# Patient Record
Sex: Male | Born: 1950 | Race: Black or African American | Hispanic: No | Marital: Married | Smoking: Never smoker
Health system: Southern US, Community
[De-identification: ages and names within clinical notes are randomized; demographics above are authoritative.]

## PROBLEM LIST (undated history)

## (undated) DIAGNOSIS — R42 Dizziness and giddiness: Secondary | ICD-10-CM

## (undated) DIAGNOSIS — R55 Syncope and collapse: Secondary | ICD-10-CM

## (undated) DIAGNOSIS — E785 Hyperlipidemia, unspecified: Secondary | ICD-10-CM

## (undated) DIAGNOSIS — I2510 Atherosclerotic heart disease of native coronary artery without angina pectoris: Secondary | ICD-10-CM

## (undated) DIAGNOSIS — H3500 Unspecified background retinopathy: Secondary | ICD-10-CM

## (undated) DIAGNOSIS — N400 Enlarged prostate without lower urinary tract symptoms: Secondary | ICD-10-CM

## (undated) HISTORY — DX: Syncope and collapse: R55

## (undated) HISTORY — DX: Unspecified background retinopathy: H35.00

## (undated) HISTORY — PX: CARDIAC CATHETERIZATION: SHX172

## (undated) HISTORY — PX: FLEXIBLE SIGMOIDOSCOPY: SHX5431

## (undated) HISTORY — PX: OTHER SURGICAL HISTORY: SHX169

## (undated) HISTORY — DX: Hyperlipidemia, unspecified: E78.5

---

## 2001-06-07 ENCOUNTER — Encounter (INDEPENDENT_AMBULATORY_CARE_PROVIDER_SITE_OTHER): Payer: Self-pay | Admitting: Specialist

## 2001-06-07 ENCOUNTER — Ambulatory Visit (HOSPITAL_COMMUNITY): Admission: RE | Admit: 2001-06-07 | Discharge: 2001-06-07 | Payer: Self-pay | Admitting: *Deleted

## 2009-02-09 ENCOUNTER — Encounter: Admission: RE | Admit: 2009-02-09 | Discharge: 2009-02-09 | Payer: Self-pay | Admitting: Otolaryngology

## 2012-05-17 ENCOUNTER — Encounter (HOSPITAL_COMMUNITY)
Admission: EM | Disposition: A | Discharge status: Home / Self Care | Payer: Self-pay | Source: Ambulatory Visit | Attending: Cardiology

## 2012-05-17 ENCOUNTER — Inpatient Hospital Stay (HOSPITAL_COMMUNITY)
Admission: EM | Admit: 2012-05-17 | Discharge: 2012-05-18 | DRG: 247 | Disposition: A | Discharge status: Home / Self Care | Payer: No Typology Code available for payment source | Source: Ambulatory Visit | Attending: Cardiology | Admitting: Cardiology

## 2012-05-17 ENCOUNTER — Other Ambulatory Visit: Payer: Self-pay | Admitting: Cardiology

## 2012-05-17 DIAGNOSIS — I200 Unstable angina: Secondary | ICD-10-CM

## 2012-05-17 DIAGNOSIS — R9439 Abnormal result of other cardiovascular function study: Secondary | ICD-10-CM

## 2012-05-17 DIAGNOSIS — I2510 Atherosclerotic heart disease of native coronary artery without angina pectoris: Principal | ICD-10-CM | POA: Diagnosis present

## 2012-05-17 DIAGNOSIS — E785 Hyperlipidemia, unspecified: Secondary | ICD-10-CM | POA: Diagnosis present

## 2012-05-17 DIAGNOSIS — Z79899 Other long term (current) drug therapy: Secondary | ICD-10-CM

## 2012-05-17 DIAGNOSIS — I10 Essential (primary) hypertension: Secondary | ICD-10-CM | POA: Diagnosis present

## 2012-05-17 DIAGNOSIS — Z955 Presence of coronary angioplasty implant and graft: Secondary | ICD-10-CM

## 2012-05-17 DIAGNOSIS — Z8249 Family history of ischemic heart disease and other diseases of the circulatory system: Secondary | ICD-10-CM

## 2012-05-17 DIAGNOSIS — R079 Chest pain, unspecified: Secondary | ICD-10-CM

## 2012-05-17 DIAGNOSIS — I249 Acute ischemic heart disease, unspecified: Secondary | ICD-10-CM

## 2012-05-17 HISTORY — PX: LEFT HEART CATHETERIZATION WITH CORONARY ANGIOGRAM: SHX5451

## 2012-05-17 LAB — COMPREHENSIVE METABOLIC PANEL
Albumin: 4.4 g/dL (ref 3.5–5.2)
Alkaline Phosphatase: 57 U/L (ref 39–117)
BUN: 12 mg/dL (ref 6–23)
Calcium: 9.7 mg/dL (ref 8.4–10.5)
Potassium: 3.8 mEq/L (ref 3.5–5.1)
Sodium: 136 mEq/L (ref 135–145)
Total Protein: 7.7 g/dL (ref 6.0–8.3)

## 2012-05-17 LAB — CBC WITH DIFFERENTIAL/PLATELET
Basophils Absolute: 0 10*3/uL (ref 0.0–0.1)
Basophils Relative: 0 % (ref 0–1)
Eosinophils Absolute: 0 10*3/uL (ref 0.0–0.7)
MCHC: 36.8 g/dL — ABNORMAL HIGH (ref 30.0–36.0)
Monocytes Absolute: 0 10*3/uL — ABNORMAL LOW (ref 0.1–1.0)
Neutro Abs: 9 10*3/uL — ABNORMAL HIGH (ref 1.7–7.7)
Neutrophils Relative %: 94 % — ABNORMAL HIGH (ref 43–77)
RDW: 12.9 % (ref 11.5–15.5)

## 2012-05-17 LAB — APTT: aPTT: 37 seconds (ref 24–37)

## 2012-05-17 LAB — MRSA PCR SCREENING: MRSA by PCR: NEGATIVE

## 2012-05-17 LAB — PROTIME-INR: INR: 1.29 (ref 0.00–1.49)

## 2012-05-17 SURGERY — LEFT HEART CATHETERIZATION WITH CORONARY ANGIOGRAM
Anesthesia: LOCAL

## 2012-05-17 MED ORDER — VERAPAMIL HCL 2.5 MG/ML IV SOLN
INTRAVENOUS | Status: AC
  Filled 2012-05-17: qty 2

## 2012-05-17 MED ORDER — TICAGRELOR 90 MG PO TABS
ORAL_TABLET | ORAL | Status: AC
  Administered 2012-05-17: 90 mg via ORAL
  Filled 2012-05-17: qty 2

## 2012-05-17 MED ORDER — ONDANSETRON HCL 4 MG/2ML IJ SOLN: 4.0000 mg | Freq: Four times a day (QID) | INTRAMUSCULAR | Status: DC | PRN

## 2012-05-17 MED ORDER — ACETAMINOPHEN 325 MG PO TABS: 650.0000 mg | ORAL_TABLET | ORAL | Status: DC | PRN

## 2012-05-17 MED ORDER — ASPIRIN EC 81 MG PO TBEC
81.0000 mg | DELAYED_RELEASE_TABLET | Freq: Every day | ORAL | Status: DC
  Administered 2012-05-18: 81 mg via ORAL
  Filled 2012-05-17: qty 1

## 2012-05-17 MED ORDER — MIDAZOLAM HCL 2 MG/2ML IJ SOLN
INTRAMUSCULAR | Status: AC
  Filled 2012-05-17: qty 2

## 2012-05-17 MED ORDER — LIDOCAINE HCL (PF) 1 % IJ SOLN
INTRAMUSCULAR | Status: AC
  Filled 2012-05-17: qty 30

## 2012-05-17 MED ORDER — HEPARIN (PORCINE) IN NACL 2-0.9 UNIT/ML-% IJ SOLN
INTRAMUSCULAR | Status: AC
  Filled 2012-05-17: qty 1000

## 2012-05-17 MED ORDER — HEPARIN SODIUM (PORCINE) 1000 UNIT/ML IJ SOLN
INTRAMUSCULAR | Status: AC
  Filled 2012-05-17: qty 1

## 2012-05-17 MED ORDER — METHYLPREDNISOLONE SODIUM SUCC 125 MG IJ SOLR
INTRAMUSCULAR | Status: AC
  Filled 2012-05-17: qty 2

## 2012-05-17 MED ORDER — SODIUM CHLORIDE 0.9 % IV SOLN
INTRAVENOUS | Status: AC
  Administered 2012-05-17 (×2): via INTRAVENOUS

## 2012-05-17 MED ORDER — ASPIRIN 81 MG PO CHEW: 81.0000 mg | CHEWABLE_TABLET | Freq: Every day | ORAL | Status: DC

## 2012-05-17 MED ORDER — TICAGRELOR 90 MG PO TABS
90.0000 mg | ORAL_TABLET | Freq: Two times a day (BID) | ORAL | Status: DC
  Administered 2012-05-18: 90 mg via ORAL
  Filled 2012-05-17 (×3): qty 1

## 2012-05-17 MED ORDER — FENTANYL CITRATE 0.05 MG/ML IJ SOLN
INTRAMUSCULAR | Status: AC
  Filled 2012-05-17: qty 2

## 2012-05-17 MED ORDER — DIPHENHYDRAMINE HCL 50 MG/ML IJ SOLN
INTRAMUSCULAR | Status: AC
  Filled 2012-05-17: qty 1

## 2012-05-17 MED ORDER — NITROGLYCERIN 1 MG/10 ML FOR IR/CATH LAB
INTRA_ARTERIAL | Status: AC
  Filled 2012-05-17: qty 10

## 2012-05-17 MED ORDER — BIVALIRUDIN 250 MG IV SOLR
INTRAVENOUS | Status: AC
  Filled 2012-05-17: qty 250

## 2012-05-17 MED ORDER — HEPARIN (PORCINE) IN NACL 2-0.9 UNIT/ML-% IJ SOLN
INTRAMUSCULAR | Status: AC
  Filled 2012-05-17: qty 500

## 2012-05-17 MED ORDER — NITROGLYCERIN 0.4 MG SL SUBL: 0.4000 mg | SUBLINGUAL_TABLET | SUBLINGUAL | Status: DC | PRN

## 2012-05-17 NOTE — H&P (Signed)
Admit date:05/17/2012 Referring Physician  Dr. Clarene Duke Primary Cardiologist:  Dr. Mayford Knife Chief complaint/reason for admission:chest pain  HPI: This is a 61yo BM with no PMH who presented to his primary MD with complaints of chest tightness intermittently last week with indigestion.  He was exercising at the gym last week and got the tightness again but felt like indigestion.  He went back to the gym the next day and the same symptoms occurred and he decided to go to the MD.  He presented today for ETT and was SOB on the treadmill.  In recovery he became dizzy, diaphoretic and dropped his SBP to and became nauseated.  His ST segments dropped horizontally by 2mm in recovery.      PMH:   None PSH:  Left arthroscopic knee surgery  ALLERGIES:  Lip swelling with shellfish Prior to Admit Meds:  Align, omeprazole Family HX:   Father died of pancreatic CA                      Mother died at 5 of MI                      Sisters 72 - several with CAD                      Brothers 2 Social HX:    History   Social History  . Marital Status: Married    Spouse Name: N/A    Number of Children: N/A  . Years of Education: N/A   Occupational History  . Not on file.   Social History Main Topics  . Smoking status: Never smoked  . Smokeless tobacco: None  . Alcohol Use: occasional wine  . Drug Use: No  . Sexually Active: Not on file   Other Topics Concern  . Not on file   Social History Narrative  . No narrative on file     ROS:  All 11 ROS were addressed and are negative except what is stated in the HPI  PHYSICAL EXAM There were no vitals filed for this visit. General: Well developed, well nourished, in no acute distress Head: Eyes PERRLA, No xanthomas.   Normal cephalic and atramatic  Lungs:   Clear bilaterally to auscultation and percussion. Heart:   HRRR S1 S2 Pulses are 2+ & equal.            No carotid bruit. No JVD.  No abdominal bruits. No femoral bruits. Abdomen: Bowel  sounds are positive, abdomen soft and non-tender without masses or                  Hernia's noted. Msk:  Back normal, normal gait. Normal strength and tone for age. Extremities:   No clubbing, cyanosis or edema.  DP +1 Neuro: Alert and oriented X 3. Psych:  Good affect, responds appropriately        Radiology:  pending  EKG: NSR  ASSESSMENT:  1. Chest pain with markedly abnormal exercise treadmill test with 2mm of horizontal ST depression during stress test with marked hypotensive BP response to exercise.   PLAN:   1.  Emergent heart cath per Dr. Anne Fu. 2.  Cardiac catheterization was discussed with the patient fully including risks on myocardial infarction, death, stroke, bleeding, arrhythmia, dye allergy, renal insufficiency or bleeding.  All patient questions and concerns were discussed and the patient understands and is willing to proceed.   3. IV solumedrol  for shellfish allergy   Quintella Reichert, MD  05/17/2012  3:49 PM

## 2012-05-17 NOTE — H&P (View-Only) (Signed)
Admit date:05/17/2012 Referring Physician  Dr. Little Primary Cardiologist:  Dr. Nikolaj Geraghty Chief complaint/reason for admission:chest pain  HPI: This is a 61yo BM with no PMH who presented to his primary MD with complaints of chest tightness intermittently last week with indigestion.  He was exercising at the gym last week and got the tightness again but felt like indigestion.  He went back to the gym the next day and the same symptoms occurred and he decided to go to the MD.  He presented today for ETT and was SOB on the treadmill.  In recovery he became dizzy, diaphoretic and dropped his SBP to 80mmHg and became nauseated.  His ST segments dropped horizontally by 2mm in recovery.      PMH:   None PSH:  Left arthroscopic knee surgery  ALLERGIES:  Lip swelling with shellfish Prior to Admit Meds:  Align, omeprazole Family HX:   Father died of pancreatic CA                      Mother died at 62 of MI                      Sisters 9 - several with CAD                      Brothers 2 Social HX:    History   Social History  . Marital Status: Married    Spouse Name: N/A    Number of Children: N/A  . Years of Education: N/A   Occupational History  . Not on file.   Social History Main Topics  . Smoking status: Never smoked  . Smokeless tobacco: None  . Alcohol Use: occasional wine  . Drug Use: No  . Sexually Active: Not on file   Other Topics Concern  . Not on file   Social History Narrative  . No narrative on file     ROS:  All 11 ROS were addressed and are negative except what is stated in the HPI  PHYSICAL EXAM There were no vitals filed for this visit. General: Well developed, well nourished, in no acute distress Head: Eyes PERRLA, No xanthomas.   Normal cephalic and atramatic  Lungs:   Clear bilaterally to auscultation and percussion. Heart:   HRRR S1 S2 Pulses are 2+ & equal.            No carotid bruit. No JVD.  No abdominal bruits. No femoral bruits. Abdomen: Bowel  sounds are positive, abdomen soft and non-tender without masses or                  Hernia's noted. Msk:  Back normal, normal gait. Normal strength and tone for age. Extremities:   No clubbing, cyanosis or edema.  DP +1 Neuro: Alert and oriented X 3. Psych:  Good affect, responds appropriately        Radiology:  pending  EKG: NSR  ASSESSMENT:  1. Chest pain with markedly abnormal exercise treadmill test with 2mm of horizontal ST depression during stress test with marked hypotensive BP response to exercise.   PLAN:   1.  Emergent heart cath per Dr. Skains. 2.  Cardiac catheterization was discussed with the patient fully including risks on myocardial infarction, death, stroke, bleeding, arrhythmia, dye allergy, renal insufficiency or bleeding.  All patient questions and concerns were discussed and the patient understands and is willing to proceed.   3. IV solumedrol   for shellfish allergy   Damico Partin R, MD  05/17/2012  3:49 PM    

## 2012-05-17 NOTE — Interval H&P Note (Signed)
History and Physical Interval Note:  05/17/2012 4:32 PM  Stephen Curtis  has presented today for surgery, with the diagnosis of urgent  The various methods of treatment have been discussed with the patient and family. After consideration of risks, benefits and other options for treatment, the patient has consented to  Procedure(s): LEFT HEART CATHETERIZATION WITH CORONARY ANGIOGRAM (N/A) as a surgical intervention .  The patient's history has been reviewed, patient examined, no change in status, stable for surgery.  I have reviewed the patient's chart and labs.  Questions were answered to the patient's satisfaction.  Discussed with he and his wife prior to EMS arrival at office. Was diaphoretic and hypotensive with diffuse ST depression. Hg 15 and Creat normal in 2013.    SKAINS, MARK

## 2012-05-17 NOTE — CV Procedure (Signed)
CARDIAC CATHETERIZATION  PROCEDURE:  Left heart catheterization with selective coronary angiography, left ventriculogram via the radial artery approach.  INDICATIONS:  62 year old male, nondiabetic with strong family history of coronary artery disease who over the past several days has been experiencing chest tightness with exercise. He came in for a exercise treadmill test, Dr.Turner. Immediately post stress, he became hypotensive, diaphoretic, diffuse ST segment depression. He slowly recovered. Because of the changes, he was brought to the cardiac catheterization lab for urgent diagnostic cardiac catheterization. High-risk stress test, exercise treadmill test.  The risks, benefits, and details of the procedure were explained to the patient, including possibilities of stroke, heart attack, death, renal impairment, arterial damage, bleeding.  The patient verbalized understanding and wanted to proceed.  Informed written consent was obtained.  PROCEDURE TECHNIQUE:  Allen's test was performed pre-and post procedure and was normal. The right radial artery site was prepped and draped in a sterile fashion. One percent lidocaine was used for local anesthesia. Using the modified Seldinger technique a 5 French hydrophilic sheath was inserted into the radial artery without difficulty. 3 mg of verapamil was administered via the sheath. A Judkins right #4 catheter with the guidance of a Versicore wire was placed in the right coronary cusp and selectively cannulated the right coronary artery. After traversing the aortic arch, 4500 units of heparin IV was administered. A Judkins left #3.5 catheter was used to selectively cannulate the left main artery. Multiple views with hand injection of Omnipaque were obtained. Catheter a pigtail catheter was used to cross into the left ventricle, hemodynamics were obtained, and a left ventriculogram was performed in the RAO position with power injection. Following the procedure,  sheath was removed, patient was hemodynamically stable, hemostasis was maintained with a Terumo T band.   CONTRAST:  Total of 80 ml.    FLOUROSCOPY TIME: 5.9 min.  COMPLICATIONS:  None.    HEMODYNAMICS:  Aortic pressure was 112/49mmHg; LV systolic pressure was ; LVEDP .  There was no gradient between the left ventricle and aorta.    ANGIOGRAPHIC DATA:    Left main: Branches into LAD and circumflex artery, no significant disease.  Left anterior descending (LAD): There is moderate disease, approximately 50% after the diagonal branch. The remainder wraps around the apex. There are some distal left to right collaterals noted.  Circumflex artery (CIRC): 2 obtuse marginal branches, no angiographically significant disease.  Right coronary artery (RCA): Dominant vessel giving rise to the posterior descending artery. There is a proximal lesion of approximately 80%, mildly calcified, then a mid lesion of 99% with associated thrombus. TIMI 2-3 flow.  LEFT VENTRICULOGRAM:  Left ventricular angiogram was done in the 30 RAO projection and revealed normal left ventricular wall motion and systolic function with an estimated ejection fraction of 65%.   IMPRESSIONS:  Severe right coronary artery disease with both proximal and mid lesion, with mid lesion 99%. Associated thrombus. There is mid LAD disease of 50%. This lesion does not appear to be flow-limiting. Normal left ventricular systolic function.  LVEDP 15 mmHg.  Ejection fraction 65%.  RECOMMENDATION:  Discussed with patient, Dr. Katrinka Blazing. We will proceed with percutaneous intervention. Wife has been notified.

## 2012-05-17 NOTE — CV Procedure (Signed)
   PERCUTANEOUS CORONARY INTERVENTION   HILLMAN ATTIG is a 62 y.o. male  INDICATION:   Treadmill test with associated exercise-induced hypotension and EKG changes. The patient was brought immediately to the cath lab from office where Dr. Chales Abrahams performed coronary angiography via the right radial approach. The patient has moderate mid LAD disease in the 50-70% range. The right coronary contains a proximal 50-70% stenosis in the mid 99% stenosis with thrombus present. Distal flow is TIMI grade 2 with competitive flow. The right coronary is felt to be the culprit.   PROCEDURE: DES implantation proximal and mid RCA for acute coronary syndrome /early positive exercise treadmill test   CONSENT: The risks, benefits, and details of the procedure were explained to the patient. Risks including death, MI, stroke, bleeding, limb ischemia, renal failure and allergy were described and accepted by the patient.  Informed written consent was obtained prior to proceeding.  PROCEDURE TECHNIQUE:  After Xylocaine anesthesia, the 5 [place] was upgraded to a 6 French sheath  in the right radial artery.   Coronary guiding shots were made using a 6 French RCA XB guide catheter. Antithrombotic therapy, bivalirudin bolus and infusion, was begun and determined to be therapeutic by ACT. Antiplatelet therapy,Brilinta 180 mg, was loaded.  The guide catheter was a nice fit that provided adequate support. A BMW wire was used to cross the proximal and mid stenosis. Predilatation with a 3.0 x 12 mm long emerged balloon was performed in the mid and proximal lesions. A Promus Premier DES, 3.5 x 16 mm long, was placed in the mid lesion and deployed at 15 atmospheres. We then positioned and deployed a 3.5 x 12 mm Promus Premier drug-eluting stent in the proximal lesion at 14 atmospheres. High pressure post dilatation was then performed with a 3.75 x 12 mm long Jeanerette Quantum to 16 atmospheres in the mid vessel and 14 atmospheres in the  proximal vessel . The stents did not overlap. TIMI grade 3 flow was noted. No evidence of distal emboli were noted .  CONTRAST:  Total of 180 cc.  COMPLICATIONS:   none.    ANGIOGRAPHIC RESULTS:   70% proximal stenosis reduced to less than 90% with TIMI grade 3 flow. The 99% mid stenosis was reduced to 0% with TIMI grade 3 flow.   IMPRESSIONS:  Successful PCI of the proximal and mid RCA with drug-eluting stents post dilated to 3.75 mm in diameter at high pressure. TIMI grade 3 flow was noted.   RECOMMENDATION:  1. Aspirin and Brilinta.  2. Will need an exercise perfusion study to determine if the LAD is ischemia producing.  3. Plan discharge in a.m.    Lesleigh Noe, MD 05/17/2012 6:14 PM

## 2012-05-18 LAB — HEMOGLOBIN A1C
Hgb A1c MFr Bld: 5.8 % — ABNORMAL HIGH (ref <5.7)
Mean Plasma Glucose: 120 mg/dL — ABNORMAL HIGH (ref <117)

## 2012-05-18 LAB — TROPONIN I
Troponin I: 1.13 ng/mL (ref <0.30)
Troponin I: 0.82 ng/mL (ref <0.30)

## 2012-05-18 LAB — BASIC METABOLIC PANEL
BUN: 13 mg/dL (ref 6–23)
Chloride: 103 mEq/L (ref 96–112)
GFR calc Af Amer: 86 mL/min — ABNORMAL LOW (ref >90)
Potassium: 4.1 mEq/L (ref 3.5–5.1)
Sodium: 139 mEq/L (ref 135–145)

## 2012-05-18 LAB — POCT I-STAT, CHEM 8
Calcium, Ion: 1.28 mmol/L (ref 1.13–1.30)
Glucose, Bld: 108 mg/dL — ABNORMAL HIGH (ref 70–99)
HCT: 44 % (ref 39.0–52.0)
Hemoglobin: 15 g/dL (ref 13.0–17.0)
TCO2: 24 mmol/L (ref 0–100)

## 2012-05-18 LAB — CBC
HCT: 43 % (ref 39.0–52.0)
Platelets: 308 10*3/uL (ref 150–400)
RDW: 12.8 % (ref 11.5–15.5)
WBC: 8.8 10*3/uL (ref 4.0–10.5)

## 2012-05-18 LAB — CK TOTAL AND CKMB (NOT AT ARMC)
CK, MB: 6.2 ng/mL (ref 0.3–4.0)
Relative Index: 2.9 — ABNORMAL HIGH (ref 0.0–2.5)
Total CK: 217 U/L (ref 7–232)

## 2012-05-18 LAB — POCT ACTIVATED CLOTTING TIME: Activated Clotting Time: 410 seconds

## 2012-05-18 MED ORDER — ATORVASTATIN CALCIUM 80 MG PO TABS
80.0000 mg | ORAL_TABLET | Freq: Every day | ORAL | Status: DC
  Filled 2012-05-18: qty 1

## 2012-05-18 MED ORDER — TICAGRELOR 90 MG PO TABS: 90.0000 mg | ORAL_TABLET | Freq: Two times a day (BID) | ORAL | Status: DC

## 2012-05-18 MED ORDER — NITROGLYCERIN 0.4 MG SL SUBL: 0.4000 mg | SUBLINGUAL_TABLET | SUBLINGUAL | Status: DC | PRN

## 2012-05-18 MED ORDER — SODIUM CHLORIDE 0.9 % IV SOLN: INTRAVENOUS | Status: DC

## 2012-05-18 MED ORDER — ATORVASTATIN CALCIUM 40 MG PO TABS: 40.0000 mg | ORAL_TABLET | Freq: Every day | ORAL | Status: DC

## 2012-05-18 NOTE — Care Management Note (Signed)
    Page 1 of 1   05/18/2012     10:13:15 AM   CARE MANAGEMENT NOTE 05/18/2012  Patient:  Stephen Curtis, Stephen Curtis   Account Number:  [id number]  Date Initiated:  05/18/2012  Documentation initiated by:  Junius Creamer  Subjective/Objective Assessment:   adm w mi     Action/Plan:   lives w wife, has [employer] ins   Anticipated DC Date:     Anticipated DC Plan:  HOME/SELF CARE      DC Planning Services  CM consult  Medication Assistance      Choice offered to / List presented to:             Status of service:   Medicare Important Message given?   (If response is "NO", the following Medicare IM given date fields will be blank) Date Medicare IM given:   Date Additional Medicare IM given:    Discharge Disposition:  HOME/SELF CARE  Per UR Regulation:  Reviewed for med. necessity/level of care/duration of stay  If discussed at Long Length of Stay Meetings, dates discussed:    Comments:  3/7 1012 debbie dowell rn,bsn gave pt 30day free and brilinta copay assist card. pt has [employer] ins.

## 2012-05-18 NOTE — Progress Notes (Signed)
CARDIAC REHAB PHASE I   PRE:  Rate/Rhythm: 76 SR    BP: sitting 167/83    SaO2:   MODE:  Ambulation: 700 ft   POST:  Rate/Rhythm: 86 SR    BP: sitting 148/74     SaO2:   Tolerated very well. Active man. Ed completed. Thinking about CRPII and will send referral to [place] [facility]. BP elevated this am. Sts he usually has good BP. Will be good to monitor in CRPII. [id number]  Harriet Masson CES, ACSM

## 2012-05-18 NOTE — Discharge Summary (Addendum)
Patient ID: Stephen Curtis MRN: [id number] DOB/AGE: 04-26-1950 62 y.o.  Admit date: 05/17/2012 Discharge date: 05/18/2012  Primary Discharge Diagnosis: Unstable angina pectoris with early positive exercise treadmill test/high-risk features Secondary Discharge Diagnosis:  Shellfish allergy. No problems with catheterization although contrast precautions were taken  Significant Diagnostic Studies: 1. Diagnostic coronary angiography, Skains M.D.  2. DES implantation, [facility], 05/17/2012  Consults: interventional cardiology  Hospital Course: This a very pleasant 62-year-old gentleman with a one to two-week history of exertional chest discomfort. Please see the attached H&P by Dr. Mayford Knife. On the day of admission he felt an exercise treadmill test in the office by developing chest pain early in exercise, hypertension, and severe ST segment depression. Dr. Mayford Knife sent him straight to the catheterization laboratory where Dr. Dorothyann Peng performed diagnostic angiography and demonstrated high-grade right coronary disease with subtotal occlusion in the mid vessel and evidence of faint left to right collaterals. He was also noted to have an intermediate mid LAD stenosis in the 50-70% range.  Interventional cardiology, Dr. Verdis Prime, was consult at and it was deemed impaired if to proceed with percutaneous coronary intervention to relieve the obstruction in the right coronary. The patient underwent drug-eluting stent implantation in the proximal and mid vessel with an excellent final result and establishment of TIMI grade 3. He's had no symptoms with ambulation post procedure.  The patient had mild troponin elevation is without significant CK-MB or EKG changes. He is felt ready for discharge and will followup with Dr. Mayford Knife in 7 days.   Discharge Exam: Blood pressure 102/50, pulse 71, temperature 97.6 F (36.4 C), temperature source Oral, resp. rate 20, height 5\' 10"  (1.778 m), weight 96.163 kg  (212 lb), SpO2 96.00%.    Lungs are clear  Cardiac exam reveals an S4  The right radial cath site is unremarkable  The patient is neurologically intact.  Labs:   Lab Results  Component Value Date   WBC 8.8 05/18/2012   HGB 15.6 05/18/2012   HCT 43.0 05/18/2012   MCV 84.6 05/18/2012   PLT 308 05/18/2012     Recent Labs Lab 05/17/12 2134 05/18/12 0505  NA 136 139  K 3.8 4.1  CL 100 103  CO2 22 25  BUN 12 13  CREATININE 0.94 1.06  CALCIUM 9.7 10.0  PROT 7.7  --   BILITOT 0.5  --   ALKPHOS 57  --   ALT 23  --   AST 22  --   GLUCOSE 180* 112*   Lab Results  Component Value Date   CKTOTAL 217 05/18/2012   CKMB 6.2* 05/18/2012   TROPONINI 0.82* 05/18/2012       Radiology: Not performed  EKG: normal  FOLLOW UP PLANS AND APPOINTMENTS     Discharge Orders   Future Orders Complete By Expires     Amb Referral to Cardiac Rehabilitation  As directed         Medication List    STOP taking these medications       ALIGN 4 MG Caps     RED YEAST RICE PO      TAKE these medications       aspirin EC 81 MG tablet  Take 81 mg by mouth daily.     atorvastatin 40 MG tablet  Commonly known as:  LIPITOR  Take 1 tablet (40 mg total) by mouth daily.     fish oil-omega-3 fatty acids 1000 MG capsule  Take 1 g by  mouth daily.     multivitamin with minerals Tabs  Take 1 tablet by mouth daily.     nitroGLYCERIN 0.4 MG SL tablet  Commonly known as:  NITROSTAT  Place 1 tablet (0.4 mg total) under the tongue every 5 (five) minutes x 3 doses as needed for chest pain.     omeprazole 20 MG capsule  Commonly known as:  PRILOSEC  Take 20 mg by mouth daily.     Ticagrelor 90 MG Tabs tablet  Commonly known as:  BRILINTA  Take 1 tablet (90 mg total) by mouth 2 (two) times daily.       Follow-up Information   Follow up with Quintella Reichert, MD On 05/24/2012. (1:15 PM)    Contact information:   [address] [place] [address] [phone]       BRING ALL  MEDICATIONS WITH YOU TO FOLLOW UP APPOINTMENTS  Time spent with patient to include physician time: 29 minutes Signed: Lesleigh Noe 05/18/2012, 2:24 PM

## 2012-05-18 NOTE — Procedures (Signed)
PIV d/c'd. D/C instructions given to patient and verbalizes understanding. Will d/c via WC to private vehicle.

## 2012-05-18 NOTE — Progress Notes (Signed)
Patient Name: Stephen Curtis Date of Encounter: 05/18/2012    SUBJECTIVE:  He has no complaints but many questions. He wants to know if he can go to [place] next week.   TELEMETRY:  NSR: Filed Vitals:   05/18/12 0007 05/18/12 0112 05/18/12 0328 05/18/12 0738  BP:  111/66 115/51 115/71  Pulse:  76 72 72  Temp: 98.3 F (36.8 C)  97.6 F (36.4 C) 97.7 F (36.5 C)  TempSrc: Oral  Oral Oral  Resp:  19 11 20   Height:      Weight:      SpO2:  97% 100% 100%    Intake/Output Summary (Last 24 hours) at 05/18/12 0907 Last data filed at 05/18/12 0700  Gross per 24 hour  Intake   1250 ml  Output   2130 ml  Net   -880 ml    LABS: Basic Metabolic Panel:  Recent Labs  96/04/54 2134 05/18/12 0505  NA 136 139  K 3.8 4.1  CL 100 103  CO2 22 25  GLUCOSE 180* 112*  BUN 12 13  CREATININE 0.94 1.06  CALCIUM 9.7 10.0  MG 2.0  --    CBC:  Recent Labs  05/17/12 2134 05/18/12 0505  WBC 9.6 8.8  NEUTROABS 9.0*  --   HGB 16.1 15.6  HCT 43.7 43.0  MCV 84.7 84.6  PLT 280 308   Cardiac Enzymes:  Recent Labs  05/17/12 2134 05/18/12 0505  TROPONINI 0.44* 1.13*    Recent Labs  05/17/12 2134  HGBA1C 5.8*     Radiology/Studies:  none  Physical Exam: Blood pressure 115/71, pulse 72, temperature 97.7 F (36.5 C), temperature source Oral, resp. rate 20, height 5\' 10"  (1.778 m), weight 96.163 kg (212 lb), SpO2 100.00%. Weight change:    Radial cath site unremarkable.  Cardiac S4  Neurological unremarkable.  ASSESSMENT:  1. ACS treated with DES RCA x 2.  2. Hyperlipidemia    Plan:  1. Rehab I  2. Home later today  Signed, Lesleigh Noe 05/18/2012, 9:07 AM

## 2012-05-21 MED FILL — Dextrose Inj 5%: INTRAVENOUS | Qty: 50 | Status: AC

## 2012-06-07 ENCOUNTER — Ambulatory Visit (HOSPITAL_COMMUNITY): Payer: No Typology Code available for payment source

## 2012-06-14 ENCOUNTER — Encounter (HOSPITAL_COMMUNITY)
Admission: RE | Admit: 2012-06-14 | Discharge: 2012-06-14 | Disposition: A | Discharge status: Home / Self Care | Payer: No Typology Code available for payment source | Source: Ambulatory Visit | Attending: Cardiology | Admitting: Cardiology

## 2012-06-14 DIAGNOSIS — Z8249 Family history of ischemic heart disease and other diseases of the circulatory system: Secondary | ICD-10-CM | POA: Insufficient documentation

## 2012-06-14 DIAGNOSIS — Z79899 Other long term (current) drug therapy: Secondary | ICD-10-CM | POA: Insufficient documentation

## 2012-06-14 DIAGNOSIS — I10 Essential (primary) hypertension: Secondary | ICD-10-CM | POA: Insufficient documentation

## 2012-06-14 DIAGNOSIS — E785 Hyperlipidemia, unspecified: Secondary | ICD-10-CM | POA: Insufficient documentation

## 2012-06-14 DIAGNOSIS — I200 Unstable angina: Secondary | ICD-10-CM | POA: Insufficient documentation

## 2012-06-14 DIAGNOSIS — I2510 Atherosclerotic heart disease of native coronary artery without angina pectoris: Secondary | ICD-10-CM | POA: Insufficient documentation

## 2012-06-14 DIAGNOSIS — Z5189 Encounter for other specified aftercare: Secondary | ICD-10-CM | POA: Insufficient documentation

## 2012-06-14 DIAGNOSIS — Z9861 Coronary angioplasty status: Secondary | ICD-10-CM | POA: Insufficient documentation

## 2012-06-14 NOTE — Progress Notes (Signed)
Cardiac Rehab Medication Review by a Pharmacist  Does the patient  feel that his/her medications are working for him/her?  no  Has the patient been experiencing any side effects to the medications prescribed?  no  Does the patient measure his/her own blood pressure or blood glucose at home?  yes   Does the patient have any problems obtaining medications due to transportation or finances?   no  Understanding of regimen: good Understanding of indications: good Potential of compliance: excellent    Pharmacist comments: Pt presented in good spirits with well thought out questions. He seems very involved in his own care and expresses excellent adherence.    Drue Stager 06/14/2012 9:13 AM

## 2012-06-18 ENCOUNTER — Emergency Department (HOSPITAL_COMMUNITY): Payer: No Typology Code available for payment source

## 2012-06-18 ENCOUNTER — Inpatient Hospital Stay (HOSPITAL_COMMUNITY)
Admission: EM | Admit: 2012-06-18 | Discharge: 2012-06-20 | DRG: 312 | Disposition: A | Discharge status: Home / Self Care | Payer: No Typology Code available for payment source | Attending: Cardiology | Admitting: Cardiology

## 2012-06-18 ENCOUNTER — Encounter (HOSPITAL_COMMUNITY): Payer: Self-pay | Admitting: Vascular Surgery

## 2012-06-18 ENCOUNTER — Encounter (HOSPITAL_COMMUNITY)
Admission: RE | Admit: 2012-06-18 | Discharge: 2012-06-18 | Disposition: A | Discharge status: Home / Self Care | Payer: No Typology Code available for payment source | Source: Ambulatory Visit | Attending: Cardiology | Admitting: Cardiology

## 2012-06-18 ENCOUNTER — Other Ambulatory Visit: Payer: Self-pay

## 2012-06-18 ENCOUNTER — Encounter (HOSPITAL_COMMUNITY): Payer: No Typology Code available for payment source

## 2012-06-18 DIAGNOSIS — Z9861 Coronary angioplasty status: Secondary | ICD-10-CM

## 2012-06-18 DIAGNOSIS — R42 Dizziness and giddiness: Secondary | ICD-10-CM | POA: Diagnosis present

## 2012-06-18 DIAGNOSIS — R55 Syncope and collapse: Principal | ICD-10-CM | POA: Diagnosis present

## 2012-06-18 DIAGNOSIS — Z79899 Other long term (current) drug therapy: Secondary | ICD-10-CM

## 2012-06-18 DIAGNOSIS — N401 Enlarged prostate with lower urinary tract symptoms: Secondary | ICD-10-CM | POA: Diagnosis present

## 2012-06-18 DIAGNOSIS — E785 Hyperlipidemia, unspecified: Secondary | ICD-10-CM | POA: Diagnosis present

## 2012-06-18 DIAGNOSIS — Z8249 Family history of ischemic heart disease and other diseases of the circulatory system: Secondary | ICD-10-CM

## 2012-06-18 DIAGNOSIS — I2510 Atherosclerotic heart disease of native coronary artery without angina pectoris: Secondary | ICD-10-CM | POA: Diagnosis present

## 2012-06-18 DIAGNOSIS — Z7982 Long term (current) use of aspirin: Secondary | ICD-10-CM

## 2012-06-18 DIAGNOSIS — N400 Enlarged prostate without lower urinary tract symptoms: Secondary | ICD-10-CM | POA: Insufficient documentation

## 2012-06-18 DIAGNOSIS — R3911 Hesitancy of micturition: Secondary | ICD-10-CM | POA: Diagnosis present

## 2012-06-18 DIAGNOSIS — N138 Other obstructive and reflux uropathy: Secondary | ICD-10-CM | POA: Diagnosis present

## 2012-06-18 HISTORY — DX: Atherosclerotic heart disease of native coronary artery without angina pectoris: I25.10

## 2012-06-18 HISTORY — DX: Benign prostatic hyperplasia without lower urinary tract symptoms: N40.0

## 2012-06-18 HISTORY — DX: Dizziness and giddiness: R42

## 2012-06-18 LAB — COMPREHENSIVE METABOLIC PANEL
Alkaline Phosphatase: 62 U/L (ref 39–117)
Alkaline Phosphatase: 63 U/L (ref 39–117)
BUN: 12 mg/dL (ref 6–23)
BUN: 11 mg/dL (ref 6–23)
CO2: 26 mEq/L (ref 19–32)
Calcium: 9.7 mg/dL (ref 8.4–10.5)
Chloride: 107 mEq/L (ref 96–112)
Creatinine, Ser: 1.04 mg/dL (ref 0.50–1.35)
GFR calc Af Amer: 88 mL/min — ABNORMAL LOW (ref >90)
GFR calc Af Amer: >90 mL/min (ref >90)
GFR calc non Af Amer: 88 mL/min — ABNORMAL LOW (ref >90)
Glucose, Bld: 110 mg/dL — ABNORMAL HIGH (ref 70–99)
Glucose, Bld: 100 mg/dL — ABNORMAL HIGH (ref 70–99)
Potassium: 3.5 mEq/L (ref 3.5–5.1)
Potassium: 3.9 mEq/L (ref 3.5–5.1)
Sodium: 142 mEq/L (ref 135–145)
Total Protein: 7.1 g/dL (ref 6.0–8.3)
Total Protein: 7.2 g/dL (ref 6.0–8.3)

## 2012-06-18 LAB — TROPONIN I
Troponin I: <0.3 ng/mL (ref <0.30)
Troponin I: <0.3 ng/mL (ref <0.30)

## 2012-06-18 LAB — CBC WITH DIFFERENTIAL/PLATELET
Basophils Absolute: 0 10*3/uL (ref 0.0–0.1)
HCT: 39.8 % (ref 39.0–52.0)
Lymphocytes Relative: 19 % (ref 12–46)
Monocytes Absolute: 0.6 10*3/uL (ref 0.1–1.0)
Neutro Abs: 3.9 10*3/uL (ref 1.7–7.7)
Platelets: 223 10*3/uL (ref 150–400)
RDW: 12.8 % (ref 11.5–15.5)
WBC: 5.7 10*3/uL (ref 4.0–10.5)

## 2012-06-18 LAB — URINALYSIS, ROUTINE W REFLEX MICROSCOPIC
Ketones, ur: NEGATIVE mg/dL
Leukocytes, UA: NEGATIVE
Nitrite: NEGATIVE
Protein, ur: NEGATIVE mg/dL

## 2012-06-18 LAB — CBC
MCHC: 36.9 g/dL — ABNORMAL HIGH (ref 30.0–36.0)
MCV: 84.1 fL (ref 78.0–100.0)
Platelets: 235 10*3/uL (ref 150–400)
RDW: 12.8 % (ref 11.5–15.5)
WBC: 8.1 10*3/uL (ref 4.0–10.5)

## 2012-06-18 LAB — D-DIMER, QUANTITATIVE: D-Dimer, Quant: <0.27 ug/mL-FEU (ref 0.00–0.48)

## 2012-06-18 MED ORDER — SODIUM CHLORIDE 0.9 % IV BOLUS (SEPSIS)
1000.0000 mL | Freq: Once | INTRAVENOUS | Status: AC
  Administered 2012-06-18: 1000 mL via INTRAVENOUS

## 2012-06-18 MED ORDER — NITROGLYCERIN 0.4 MG SL SUBL: 0.4000 mg | SUBLINGUAL_TABLET | SUBLINGUAL | Status: DC | PRN

## 2012-06-18 MED ORDER — ENOXAPARIN SODIUM 40 MG/0.4ML ~~LOC~~ SOLN
40.0000 mg | SUBCUTANEOUS | Status: DC
  Administered 2012-06-18 – 2012-06-19 (×2): 40 mg via SUBCUTANEOUS
  Filled 2012-06-18 (×3): qty 0.4

## 2012-06-18 MED ORDER — ONDANSETRON HCL 4 MG/2ML IJ SOLN: 4.0000 mg | Freq: Four times a day (QID) | INTRAMUSCULAR | Status: DC | PRN

## 2012-06-18 MED ORDER — SODIUM CHLORIDE 0.9 % IJ SOLN: 3.0000 mL | INTRAMUSCULAR | Status: DC | PRN

## 2012-06-18 MED ORDER — SODIUM CHLORIDE 0.9 % IJ SOLN
3.0000 mL | Freq: Two times a day (BID) | INTRAMUSCULAR | Status: DC
  Administered 2012-06-18 – 2012-06-20 (×4): 3 mL via INTRAVENOUS

## 2012-06-18 MED ORDER — ATORVASTATIN CALCIUM 40 MG PO TABS
40.0000 mg | ORAL_TABLET | Freq: Every day | ORAL | Status: DC
  Administered 2012-06-19 – 2012-06-20 (×2): 40 mg via ORAL
  Filled 2012-06-18 (×3): qty 1

## 2012-06-18 MED ORDER — TICAGRELOR 90 MG PO TABS
90.0000 mg | ORAL_TABLET | Freq: Two times a day (BID) | ORAL | Status: DC
  Filled 2012-06-18: qty 1

## 2012-06-18 MED ORDER — ACETAMINOPHEN 325 MG PO TABS: 650.0000 mg | ORAL_TABLET | ORAL | Status: DC | PRN

## 2012-06-18 MED ORDER — SODIUM CHLORIDE 0.9 % IV SOLN: 250.0000 mL | INTRAVENOUS | Status: DC | PRN

## 2012-06-18 MED ORDER — TICAGRELOR 90 MG PO TABS
90.0000 mg | ORAL_TABLET | Freq: Two times a day (BID) | ORAL | Status: DC
  Administered 2012-06-18 – 2012-06-20 (×4): 90 mg via ORAL
  Filled 2012-06-18 (×5): qty 1

## 2012-06-18 MED ORDER — ADULT MULTIVITAMIN W/MINERALS CH
1.0000 | ORAL_TABLET | Freq: Every day | ORAL | Status: DC
  Administered 2012-06-18 – 2012-06-20 (×3): 1 via ORAL
  Filled 2012-06-18 (×3): qty 1

## 2012-06-18 MED ORDER — ASPIRIN EC 81 MG PO TBEC
81.0000 mg | DELAYED_RELEASE_TABLET | Freq: Every day | ORAL | Status: DC
  Administered 2012-06-19 – 2012-06-20 (×2): 81 mg via ORAL
  Filled 2012-06-18 (×2): qty 1

## 2012-06-18 NOTE — ED Provider Notes (Signed)
History     CSN: [id number]  Arrival date & time 06/18/12  1557   First MD Initiated Contact with Patient 06/18/12 1621      Chief Complaint  Patient presents with  . Near Syncope    (Consider location/radiation/quality/duration/timing/severity/associated sxs/prior treatment) HPI Comments: Patient in cardiac rehabilitation after episode of syncope. He finished using the standard machine to the bathroom where he had sudden onset of lightheadedness and became flushed and woke up on the floor. He then had another episode of nausea diaphoresis flushing lightheadedness when he almost lost consciousness. He is not be hypotensive in the 60s and bradycardic in the 50s. Denies any chest pain, shortness of breath, vomiting. Is back to baseline now. Had 2 stents placed March 6. Denies any recent illnesses. Good by mouth intake and urine output. No fevers. Felt well prior to rehabilitation.  The history is provided by the patient and a caregiver.    Past Medical History  Diagnosis Date  . BPH (benign prostatic hypertrophy)   . Hyperlipemia   . Vertigo   . CAD (coronary artery disease)     Past Surgical History  Procedure Laterality Date  . Cardiac catheterization    . Knee arthroscopic Left     History reviewed. No pertinent family history.  History  Substance Use Topics  . Smoking status: Never Smoker   . Smokeless tobacco: Never Used  . Alcohol Use: Yes     Comment: occasionally      Review of Systems  Constitutional: Positive for activity change, appetite change and fatigue. Negative for fever.  HENT: Negative for congestion and rhinorrhea.   Respiratory: Negative for cough, chest tightness and shortness of breath.   Cardiovascular: Negative for chest pain.  Gastrointestinal: Positive for nausea. Negative for abdominal pain.  Genitourinary: Negative for dysuria and hematuria.  Musculoskeletal: Negative for back pain.  Skin: Negative for rash.  Neurological: Positive for  dizziness, syncope, weakness and light-headedness. Negative for headaches.  A complete 10 system review of systems was obtained and all systems are negative except as noted in the HPI and PMH.    Allergies  Shrimp  Home Medications   No current outpatient prescriptions on file.  BP 130/82  Pulse 64  Temp(Src) 97.9 F (36.6 C) (Oral)  Resp 18  Ht 5' 10.5" (1.791 m)  Wt 201 lb 11.5 oz (91.5 kg)  BMI 28.53 kg/m2  SpO2 100%  Physical Exam  Constitutional: He is oriented to person, place, and time. He appears well-developed and well-nourished. No distress.  HENT:  Head: Normocephalic and atraumatic.  Mouth/Throat: Oropharynx is clear and moist. No oropharyngeal exudate.  Eyes: Conjunctivae and EOM are normal. Pupils are equal, round, and reactive to light.  Neck: Normal range of motion. Neck supple.  Cardiovascular: Normal rate, regular rhythm and normal heart sounds.   No murmur heard. Pulmonary/Chest: Effort normal and breath sounds normal. No respiratory distress.  Abdominal: Soft. There is no tenderness. There is no rebound and no guarding.  Musculoskeletal: Normal range of motion. He exhibits no edema and no tenderness.  Neurological: He is alert and oriented to person, place, and time. No cranial nerve deficit. He exhibits normal muscle tone. Coordination normal.  5 out of 5 strength throughout, cranial nerves 2 to 12 intact, no ataxia finger to nose  Skin: Skin is warm.    ED Course  Procedures (including critical care time)  Labs Reviewed  COMPREHENSIVE METABOLIC PANEL - Abnormal; Notable for the following:    Glucose,  Bld 110 (*)    GFR calc non Af Amer 76 (*)    GFR calc Af Amer 88 (*)    All other components within normal limits  CBC WITH DIFFERENTIAL - Abnormal; Notable for the following:    MCHC 36.4 (*)    All other components within normal limits  COMPREHENSIVE METABOLIC PANEL - Abnormal; Notable for the following:    Glucose, Bld 100 (*)    GFR calc non  Af Amer 88 (*)    All other components within normal limits  CBC - Abnormal; Notable for the following:    MCHC 36.9 (*)    All other components within normal limits  TROPONIN I  PRO B NATRIURETIC PEPTIDE  D-DIMER, QUANTITATIVE  URINALYSIS, ROUTINE W REFLEX MICROSCOPIC  TROPONIN I  TROPONIN I  TROPONIN I  POCT I-STAT TROPONIN I   Dg Chest 2 View  06/18/2012  *RADIOLOGY REPORT*  Clinical Data: Syncopal episode today  CHEST - 2 VIEW  Comparison: None.  Findings: No active infiltrate or effusion is seen.  Mediastinal contours appear normal.  The heart is within upper limits of normal.  There are mild degenerative changes throughout the thoracic spine.  IMPRESSION: No active lung disease.  Heart upper normal.   Original Report Authenticated By: Dwyane Dee, M.D.      1. Syncope       MDM  Syncopal episode with bradycardia and hypotension. No chest pain or shortness of breath. Good by mouth intake and urine output. Feeling better at this time.  Blood pressure has improved to 130s. Patient is not orthostatic. EKG is unchanged. No focal neurological deficits.  Troponin negative. Suspect probable vasovagal syncope though cannot rule out ischemic event. Patient with known LAD disease it was not intervened upon.  Discussed with Dr. Donnie Aho of cardiology who will admit patient for observation.     Date: 06/18/2012  Rate: 59  Rhythm: normal sinus rhythm  QRS Axis: normal  Intervals: normal  ST/T Wave abnormalities: normal  Conduction Disutrbances:none  Narrative Interpretation:   Old EKG Reviewed: unchanged    Glynn Octave, MD 06/18/12 2326

## 2012-06-18 NOTE — Progress Notes (Signed)
Stephen Curtis came for his first day of exercise at cardiac rehab. Entry blood pressure 116/76.  Telemetry rhythm Sinus heart rate 72. Stephen Curtis completed the treadmill without difficulty.  Blood pressure 150/90 on the nustep.  Patient dropped his arms repeat blood pressure 136/78.  Stephen Curtis went to the bathroom came out and said he felt lightheaded and dizzy and nauseated.  Denied chest pain. Patient assisted to the chair feet elevated.  Initial blood pressure 82/50.  Telemetry rhythm Sinus Brady 56.  Patient did dry heave.  Repeat blood pressure 68/50 patient awake but diaphoretic.  Rapid response called  Repeat blood pressure 94/60.  Patient continued to complain of feeling lightheaded.  Repeat blood pressure 69/40.  Patient placed on the stretcher and placed into Trendelenburg.  Repeat blood pressure 119/77 via automatic blood pressure cuff.  Stephen Curtis reported feeling better. Patient transported to the emergency department for further evaluation.  Dr Norris Cross office called and notified. Stephen Curtis said that when he went into the bathroom he became lightheaded and laid down on the floor.  Stephen Curtis says he thinks thinks he passed out. Patient was transported to the emergency via stretcher on the zoll.  Report given to the ED RN and ED physician. Patient son called and notified of events.

## 2012-06-18 NOTE — ED Notes (Signed)
Pt reports to the ED from cardiac rehab where pt reports he went to the bathroom where he had sudden onset lightheadedness and became flushed then reports he awoke on the floor. Pt left the bathroom and had another witnessed episode where he became nauseated, diaphoretic, flushed, and lightheaded. Staff reports near syncope. During the episode pt was noted to be hypotensive in the 60s systolic and bradycardic in the 50s. Pt had completed 2 stations of exercise. Pt denies active CP, SOB, nausea, or associated symptoms at this time. Pt appears pale. Pt denies any fevers or recent illness. Pt A&O x4.

## 2012-06-18 NOTE — H&P (Addendum)
History and Physical   Admit date: 06/18/2012 Name:  Stephen Curtis Medical record number: [id number] DOB/Age:  07-14-1950  62 y.o. male  Referring Physician:  [facility]   Primary Cardiologist:  Dr. Carolanne Grumbling  Primary Physician:  Dr. Catha Gosselin  Chief complaint/reason for admission: Passed out  HPI:  This 62 year old male recently developed some angina and had a hypotensive response following a diagnostic treadmill test and had urgent catheterization showing proximal and mid right coronary artery stenosis and a moderate LAD stenosis. He had stenting with 2 Promus drug-eluting stents a 3.5 mm in the proximal and mid right coronary artery by Dr. Katrinka Blazing on 05/17/12. He has done well since then. He was beginning his first day of cardiac rehabilitation today and had walked the treadmill and then was done the stair stepper. He was told to stop using his arms because of an elevated blood pressure. He then got off of the stair stepper and went to the restroom where he became lightheaded nauseated and diaphoretic and may have vomited. He then sat down the floor and may have passed out. He was then brought up to the emergency room where his blood pressure and pulse recovered. He was noted to be hypotensive and bradycardic by the rehabilitation nurses following that. He did not have any midsternal chest pressure and his EKG and initial troponin were negative. He currently feels fine. He states he felt this way after the treadmill test that was done previously. He has not had a stress test or Cardiolite since his catheterization.    Past Medical History  Diagnosis Date  . BPH (benign prostatic hypertrophy)   . Hyperlipemia   . Vertigo   . CAD (coronary artery disease)        Past Surgical History  Procedure Laterality Date  . Cardiac catheterization    . Knee arthroscopic Left   .  Allergies: is allergic to shrimp.   Medications: Prior to Admission medications   Medication  Sig Start Date End Date Taking? Authorizing Provider  aspirin EC 81 MG tablet Take 81 mg by mouth daily.   Yes Historical Provider, MD  atorvastatin (LIPITOR) 40 MG tablet Take 1 tablet (40 mg total) by mouth daily. 05/18/12  Yes Lyn Records III, MD  cetirizine (ZYRTEC) 10 MG tablet Take 10 mg by mouth daily.   Yes Historical Provider, MD  fish oil-omega-3 fatty acids 1000 MG capsule Take 1 g by mouth daily.    Yes Historical Provider, MD  Multiple Vitamin (MULTIVITAMIN WITH MINERALS) TABS Take 1 tablet by mouth daily.   Yes Historical Provider, MD  nitroGLYCERIN (NITROSTAT) 0.4 MG SL tablet Place 1 tablet (0.4 mg total) under the tongue every 5 (five) minutes x 3 doses as needed for chest pain. 05/18/12  Yes Lyn Records III, MD  Ticagrelor (BRILINTA) 90 MG TABS tablet Take 1 tablet (90 mg total) by mouth 2 (two) times daily. 05/18/12  Yes Lesleigh Noe, MD    Family History:  Family Status  Relation Status Death Age  . Father Deceased 58    died of pancreatic cancer  . Mother Deceased 24    died of MI  . Sister Deceased     died of colon cancer  . Sister Deceased     died of pericarditis  . Sister Alive     CAD, CABG  . Sister Alive     MVR  . Sister Alive     arryhthmias  Social History:   reports that he has never smoked. He has never used smokeless tobacco. He reports that  drinks alcohol. He reports that he does not use illicit drugs.   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 He otherwise feels well. Other than as noted above, the remainder of the review of systems is normal  Physical Exam: BP 128/76  Pulse 62  Temp(Src) 97.5 F (36.4 C) (Oral)  Resp 15  SpO2 100% General appearance: alert, cooperative, appears stated age and no distress Head: Normocephalic, without obvious abnormality, atraumatic Eyes: conjunctivae/corneas  clear. PERRL, EOM's intact. Fundi not examined Neck: no adenopathy, no carotid bruit, no JVD and supple, symmetrical, trachea midline Lungs: clear to auscultation bilaterally Heart: regular rate and rhythm, S1, S2 normal, no murmur, click, rub or gallop Abdomen: soft, non-tender; bowel sounds normal; no masses,  no organomegaly Rectal: deferred Extremities: extremities normal, atraumatic, no cyanosis or edema Pulses: 2+ and symmetric Skin: Skin color, texture, turgor normal. No rashes or lesions Neurologic: Grossly normal  Labs: CBC  Recent Labs  06/18/12 1621  WBC 5.7  RBC 4.74  HGB 14.5  HCT 39.8  PLT 223  MCV 84.0  MCH 30.6  MCHC 36.4*  RDW 12.8  LYMPHSABS 1.1  MONOABS 0.6  EOSABS 0.1  BASOSABS 0.0   CMP   Recent Labs  06/18/12 1621  NA 139  K 3.5  CL 103  CO2 25  GLUCOSE 110*  BUN 12  CREATININE 1.04  CALCIUM 9.6  PROT 7.1  ALBUMIN 4.4  AST 25  ALT 27  ALKPHOS 62  BILITOT 0.4  GFRNONAA 76*  GFRAA 88*   BNP (last 3 results)  Recent Labs  06/18/12 1636  PROBNP 9.5   Cardiac Panel (last 3 results)  Recent Labs  06/18/12 1636  TROPONINI <0.30   Thyroid  Lab Results  Component Value Date   TSH 0.399 05/17/2012    EKG: Normal  Radiology: Chest x-ray-no active disease, heart upper limits from normal   IMPRESSIONS: 1. Syncopal episode following exercise that could have been vasovagal in origin, but could also be a manifestation of ischemia 2. Coronary artery disease with recent stenting of the right coronary artery with residual stenosis in the LAD 3. Hyperlipidemia 4. History of BPH 5. History of vertigo  PLAN: Place on telemetry overnight and check serial enzymes. Keep n.p.o. after midnight for either repeat catheterization to determine stent patency and possible due flow wire of the LAD or  possible stress testing to see if symptoms can be reproduced to determine if he has ischemia in the LAD.   Signed: Darden Palmer MD  [facility]  06/18/2012, 6:39 PM

## 2012-06-19 LAB — TROPONIN I: Troponin I: <0.3 ng/mL (ref <0.30)

## 2012-06-19 NOTE — Progress Notes (Addendum)
SUBJECTIVE:  No further dizziness or syncope  OBJECTIVE:   Vitals:   Filed Vitals:   06/18/12 1957 06/19/12 0145 06/19/12 0407 06/19/12 0800  BP: 130/82 114/72 106/70 126/78  Pulse: 64 64 68 70  Temp: 97.9 F (36.6 C) 98.1 F (36.7 C) 98 F (36.7 C) 98 F (36.7 C)  TempSrc: Oral Oral Oral Oral  Resp: 18 18 19    Height: 5' 10.5" (1.791 m)     Weight: 91.5 kg (201 lb 11.5 oz)  90.9 kg (200 lb 6.4 oz)   SpO2: 100% 98% 97% 99%   I&O's:   Intake/Output Summary (Last 24 hours) at 06/19/12 1051 Last data filed at 06/19/12 0900  Gross per 24 hour  Intake    723 ml  Output    350 ml  Net    373 ml   TELEMETRY: Reviewed telemetry pt in NSR:     PHYSICAL EXAM General: Well developed, well nourished, in no acute distress Head: Eyes PERRLA, No xanthomas.   Normal cephalic and atramatic  Lungs:   Clear bilaterally to auscultation and percussion. Heart:   HRRR S1 S2 Pulses are 2+ & equal. Abdomen: Bowel sounds are positive, abdomen soft and non-tender without masses  Extremities:   No clubbing, cyanosis or edema.  DP +1 Neuro: Alert and oriented X 3. Psych:  Good affect, responds appropriately   LABS: Basic Metabolic Panel:  Recent Labs  16/10/96 1621 06/18/12 1920  NA 139 142  K 3.5 3.9  CL 103 107  CO2 25 26  GLUCOSE 110* 100*  BUN 12 11  CREATININE 1.04 0.98  CALCIUM 9.6 9.7   Liver Function Tests:  Recent Labs  06/18/12 1621 06/18/12 1920  AST 25 26  ALT 27 27  ALKPHOS 62 63  BILITOT 0.4 0.5  PROT 7.1 7.2  ALBUMIN 4.4 4.6   No results found for this basename: LIPASE, AMYLASE,  in the last 72 hours CBC:  Recent Labs  06/18/12 1621 06/18/12 1920  WBC 5.7 8.1  NEUTROABS 3.9  --   HGB 14.5 15.0  HCT 39.8 40.7  MCV 84.0 84.1  PLT 223 235   Cardiac Enzymes:  Recent Labs  06/18/12 1921 06/19/12 0135 06/19/12 0707  TROPONINI <0.30 <0.30 <0.30   BNP: No components found with this basename: POCBNP,  D-Dimer:  Recent Labs  06/18/12 1636   DDIMER <0.27   Coag Panel:   Lab Results  Component Value Date   INR 1.29 05/17/2012    RADIOLOGY: Dg Chest 2 View  06/18/2012  *RADIOLOGY REPORT*  Clinical Data: Syncopal episode today  CHEST - 2 VIEW  Comparison: None.  Findings: No active infiltrate or effusion is seen.  Mediastinal contours appear normal.  The heart is within upper limits of normal.  There are mild degenerative changes throughout the thoracic spine.  IMPRESSION: No active lung disease.  Heart upper normal.   Original Report Authenticated By: Dwyane Dee, M.D.    IMPRESSIONS:  1. Syncopal episode following exercise that could have been vasovagal in origin, but could also be a manifestation of ischemia.  By his description it sounds like it was probably micturition syncope. 2. Coronary artery disease with recent stenting of the right coronary artery with residual stenosis in the LAD  3. Hyperlipidemia  4. History of BPH  5. History of vertigo  PLAN:  1.  Continue tele monitoring 2.  NPO after midnight 3.  Nuclear stress test in am Wed - tried to get on schedule  for today but nuclear med is full 4.  2D echo eval LVF 5.  Will need outpatient [employer] monitor for 4 weeks      Quintella Reichert, MD  06/19/2012  10:51 AM

## 2012-06-19 NOTE — Progress Notes (Signed)
Utilization Review Completed.   Oval Cavazos, RN, BSN Nurse Case Manager  [phone]  

## 2012-06-19 NOTE — Progress Notes (Signed)
*  PRELIMINARY RESULTS* Echocardiogram 2D Echocardiogram has been performed.  Stephen Curtis 06/19/2012, 12:52 PM

## 2012-06-20 ENCOUNTER — Inpatient Hospital Stay (HOSPITAL_COMMUNITY): Payer: No Typology Code available for payment source

## 2012-06-20 ENCOUNTER — Encounter (HOSPITAL_COMMUNITY): Payer: No Typology Code available for payment source

## 2012-06-20 MED ORDER — TECHNETIUM TC 99M SESTAMIBI - CARDIOLITE
10.0000 | Freq: Once | INTRAVENOUS | Status: AC | PRN
  Administered 2012-06-20: 10 via INTRAVENOUS

## 2012-06-20 MED ORDER — TECHNETIUM TC 99M SESTAMIBI GENERIC - CARDIOLITE
30.0000 | Freq: Once | INTRAVENOUS | Status: AC | PRN
  Administered 2012-06-20: 30 via INTRAVENOUS

## 2012-06-20 NOTE — Discharge Summary (Signed)
Patient ID: Stephen Curtis MRN: [id number] DOB/AGE: 61-Nov-1952 61 y.o.  Admit date: 06/18/2012 Discharge date: 06/20/2012  Primary Discharge Diagnosis Vasovagal syncope  Secondary Discharge Diagnosis  CAD s/p PCI of RCA  Dyslipidemia  BPH  Vertigo   Consults: None  Hospital Course: This 62 year old male recently developed some angina and had a hypotensive response following a diagnostic treadmill test and had urgent catheterization showing proximal and mid right coronary artery stenosis and a moderate LAD stenosis. He had stenting with 2 Promus drug-eluting stents a 3.5 mm in the proximal and mid right coronary artery by Dr. Katrinka Blazing on 05/17/12. He has done well since then. He was beginning his first day of cardiac rehabilitation  and had walked the treadmill and then was done the stair stepper. He was told to stop using his arms because of an elevated blood pressure. He then got off of the stair stepper and went to the restroom where he became lightheaded nauseated and diaphoretic and may have vomited. He then sat down the floor and may have passed out. He was then brought up to the emergency room where his blood pressure and pulse recovered. He was noted to be hypotensive and bradycardic by the rehabilitation nurses following that. He did not have any midsternal chest pressure and his EKG and initial troponin were negative. He was admitted and ruled out for MI.  He has no further symptoms.  He did give a history of vasovagal syncope in the past on several occasions giving blood. It was felt his symptoms were due to micturition syncope in cardiac rehab.  He underwent echo showing normal LVF and underwent nuclear stress test.  Results are pending at the time of this dictation.      Discharge Exam: Blood pressure 122/78, pulse 70, temperature 97.8 F (36.6 C), temperature source Oral, resp. rate 20, height 5' 10.5" (1.791 m), weight 90.81 kg (200 lb 3.2 oz), SpO2 99.00%.   General appearance:  alert Resp: clear to auscultation bilaterally Cardio: regular rate and rhythm, S1, S2 normal, no murmur, click, rub or gallop Extremities: extremities normal, atraumatic, no cyanosis or edema Labs:   Lab Results  Component Value Date   WBC 8.1 06/18/2012   HGB 15.0 06/18/2012   HCT 40.7 06/18/2012   MCV 84.1 06/18/2012   PLT 235 06/18/2012    Recent Labs Lab 06/18/12 1920  NA 142  K 3.9  CL 107  CO2 26  BUN 11  CREATININE 0.98  CALCIUM 9.7  PROT 7.2  BILITOT 0.5  ALKPHOS 63  ALT 27  AST 26  GLUCOSE 100*   Lab Results  Component Value Date   CKTOTAL 217 05/18/2012   CKMB 6.2* 05/18/2012   TROPONINI <0.30 06/19/2012        Radiology: EKG:NSR  FOLLOW UP PLANS AND APPOINTMENTS  Future Appointments Provider Department Dept Phone   06/25/2012 2:45 PM Mc-Phase2 Monitor 3 [facility] CARDIAC [facility] [phone]   06/27/2012 2:45 PM Mc-Phase2 Monitor 3 [facility] CARDIAC [facility] [phone]   06/29/2012 2:45 PM Mc-Phase2 Monitor 3 [facility] [phone]   07/02/2012 2:45 PM Mc-Phase2 Monitor 3 [facility] [phone]   07/04/2012 2:45 PM Mc-Phase2 Monitor 3 [facility] [phone]   07/06/2012 2:45 PM Mc-Phase2 Monitor 3 [facility] CARDI[facility] [phone]   07/09/2012 2:45 PM Mc-Phase2 Monitor 3 [facility] CARDI[facility] [phone]   07/11/2012 2:45 PM  Mc-Phase2 Monitor 3 [facility] CARDIAC [facility] [phone]   07/13/2012 2:45 PM Mc-Phase2 Monitor 3 [facility] CARDI[facility] [phone]   07/16/2012 2:45 PM Mc-Phase2 Monitor 3 [facility] CARDIAC [facility] [phone]   07/18/2012 2:45 PM Mc-Phase2 Monitor 3 [facility] CARDIAC [facility] [phone]   07/20/2012 2:45 PM Mc-Phase2 Monitor 3 [facility] CARDIAC [facility] [phone]   07/23/2012 2:45 PM  Mc-Phase2 Monitor 3 [facility] C[facility] [phone]   07/25/2012 2:45 PM Mc-Phase2 Monitor 3 [facility] CARDIAC [facility] [phone]   07/27/2012 2:45 PM Mc-Phase2 Monitor 3 [facility] [phone]   07/30/2012 2:45 PM Mc-Phase2 Monitor 3 [facility] [phone]   08/01/2012 2:45 PM Mc-Phase2 Monitor 3 [facility] CARDIAC [facility] [phone]   08/03/2012 2:45 PM Mc-Phase2 Monitor 3 [facility] CARDIAC [facility] [phone]   08/06/2012 2:45 PM Mc-Phase2 Monitor 3 [facility]ARDI[facility] [phone]   08/08/2012 2:45 PM Mc-Phase2 Monitor 3 [facility] [phone]   08/10/2012 2:45 PM Mc-Phase2 Monitor 3 [facility] CARDIAC [facility] [phone]   08/13/2012 2:45 PM Mc-Phase2 Monitor 3 [facility] [phone]   08/15/2012 2:45 PM Mc-Phase2 Monitor 3 [facility] C[facility] [phone]   08/17/2012 2:45 PM Mc-Phase2 Monitor 3 [facility] CARDIAC [facility] [phone]   08/20/2012 2:45 PM Mc-Phase2 Monitor 3 [facility] CARDIAC [facility] [phone]   08/22/2012 2:45 PM Mc-Phase2 Monitor 3 [facility] CARDI[facility] [phone]   08/24/2012 2:45 PM Mc-Phase2 Monitor 3 [facility] CARDIAC [facility] [phone]   08/27/2012 2:45 PM Mc-Phase2 Monitor 3 [facility] CARDIAC [facility] [phone]   08/29/2012 2:45 PM Mc-Phase2 Monitor 3 [facility] CARDIAC [facility] [phone]   08/31/2012 2:45 PM Mc-Phase2 Monitor 3 [facility] CARDI[facility] [phone]   09/03/2012 2:45 PM Mc-Phase2 Monitor 3 [facility] CARDIAC [facility] [phone]   09/05/2012 2:45 PM Mc-Phase2 Monitor 3 [facility]ARDI[facility] [phone]   09/07/2012 2:45 PM Mc-Phase2  Monitor 3 [facility] CARDIAC [facility] [phone]   09/10/2012 2:45 PM Mc-Phase2 Monitor 3 [facility] CARDI[facility] [phone]   09/12/2012 2:45 PM Mc-Phase2 Monitor 3 [facility] CARDIAC [facility] [phone]   09/14/2012 2:45 PM Mc-Phase2 Monitor 3 [facility] [phone]   09/17/2012 2:45 PM Mc-Phase2 Monitor 3 [facility]ARD[facility] [phone]   09/19/2012 2:45 PM Mc-Phase2 Monitor 3 [facility] CARDIAC [facility] [phone]   09/21/2012 2:45 PM Mc-Phase2 Monitor 3 [facility] CARDIAC REHAB [phone]       Medication List    TAKE these medications       aspirin EC 81 MG tablet  Take 81 mg by mouth daily.     atorvastatin 40 MG tablet  Commonly known as:  LIPITOR  Take 1 tablet (40 mg total) by mouth daily.     cetirizine 10 MG tablet  Commonly known as:  ZYRTEC  Take 10 mg by mouth daily.     fish oil-omega-3 fatty acids 1000 MG capsule  Take 1 g by mouth daily.     multivitamin with minerals Tabs  Take 1 tablet by mouth daily.     nitroGLYCERIN 0.4 MG SL tablet  Commonly known as:  NITROSTAT  Place 1 tablet (0.4 mg total) under the tongue every 5 (five) minutes x 3 doses as needed for chest pain.     Ticagrelor 90 MG Tabs tablet  Commonly known as:  BRILINTA  Take 1 tablet (90 mg total) by mouth 2 (two) times daily.           Follow-up Information   Follow up with Quintella Reichert, MD On 07/11/2012. (at 10:15)    Contact information:   [address] [place] [address] [phone]       BRING ALL MEDICATIONS WITH YOU TO FOLLOW UP APPOINTMENTS  Time spent with patient to include physician time:40 minutes Signed: TURNER,TRACI R 06/20/2012, 11:57 AM

## 2012-06-20 NOTE — Progress Notes (Signed)
05/20/12 Patient discharged and verbalizes understanding of all discharge paperwork and follow up appointments. Anastasia Fiedler RN

## 2012-06-22 ENCOUNTER — Encounter (HOSPITAL_COMMUNITY): Payer: No Typology Code available for payment source

## 2012-06-25 ENCOUNTER — Encounter (HOSPITAL_COMMUNITY): Payer: No Typology Code available for payment source

## 2012-06-25 ENCOUNTER — Encounter (HOSPITAL_COMMUNITY): Admission: RE | Admit: 2012-06-25 | Payer: No Typology Code available for payment source | Source: Ambulatory Visit

## 2012-06-27 ENCOUNTER — Encounter (HOSPITAL_COMMUNITY): Payer: No Typology Code available for payment source

## 2012-06-29 ENCOUNTER — Encounter (HOSPITAL_COMMUNITY): Payer: No Typology Code available for payment source

## 2012-07-02 ENCOUNTER — Encounter (HOSPITAL_COMMUNITY): Payer: No Typology Code available for payment source

## 2012-07-04 ENCOUNTER — Encounter (HOSPITAL_COMMUNITY): Payer: No Typology Code available for payment source

## 2012-07-04 ENCOUNTER — Encounter (HOSPITAL_COMMUNITY)
Admission: RE | Admit: 2012-07-04 | Discharge: 2012-07-04 | Disposition: A | Discharge status: Home / Self Care | Payer: No Typology Code available for payment source | Source: Ambulatory Visit | Attending: Cardiology | Admitting: Cardiology

## 2012-07-06 ENCOUNTER — Encounter (HOSPITAL_COMMUNITY): Payer: No Typology Code available for payment source

## 2012-07-06 ENCOUNTER — Encounter (HOSPITAL_COMMUNITY)
Admission: RE | Admit: 2012-07-06 | Discharge: 2012-07-06 | Disposition: A | Discharge status: Home / Self Care | Payer: No Typology Code available for payment source | Source: Ambulatory Visit | Attending: Cardiology | Admitting: Cardiology

## 2012-07-09 ENCOUNTER — Encounter (HOSPITAL_COMMUNITY): Payer: No Typology Code available for payment source

## 2012-07-09 ENCOUNTER — Encounter (HOSPITAL_COMMUNITY): Admission: RE | Admit: 2012-07-09 | Payer: No Typology Code available for payment source | Source: Ambulatory Visit

## 2012-07-11 ENCOUNTER — Encounter (HOSPITAL_COMMUNITY): Payer: No Typology Code available for payment source

## 2012-07-11 ENCOUNTER — Encounter (HOSPITAL_COMMUNITY)
Admission: RE | Admit: 2012-07-11 | Discharge: 2012-07-11 | Disposition: A | Discharge status: Home / Self Care | Payer: No Typology Code available for payment source | Source: Ambulatory Visit | Attending: Cardiology | Admitting: Cardiology

## 2012-07-11 NOTE — Progress Notes (Signed)
Stephen Curtis 62 y.o. male Nutrition Note Spoke with pt.  Nutrition Plan and Nutrition Survey goals reviewed with pt. Pt is following Step 2 of the Therapeutic Lifestyle Changes diet. Pt wants to lose wt. Pt has been trying to lose wt by changing his diet and getting back to his exercise routine. Pt reports his UBW before his stent was 212-213#. Pt wt today was 201.7#, which is down approximately 10 lbs. Pt wt down 1.6 kg (3.5 lb) over the past 3 weeks. Rate of wt loss appears safe. Wt loss tips reviewed. Pt expressed understanding of the information reviewed. Pt aware of nutrition education classes offered and is unable to attend nutrition classes due to work schedule.  Nutrition Diagnosis   Food-and nutrition-related knowledge deficit related to lack of exposure to information as related to diagnosis of: ? CVD ? Pre-DM (A1c 5.8) ?   Overweight related to excessive energy intake as evidenced by a BMI of 29.1    Nutrition RX/ Estimated Daily Nutrition Needs for: wt loss  1650-2150 Kcal, 45-55 gm fat, 10-14 gm sat fat, 1.6-2.1 gm trans-fat, <1500 mg sodium   Nutrition Intervention   Pt's individual nutrition plan including cholesterol goals reviewed with pt.   Benefits of adopting Therapeutic Lifestyle Changes discussed when Medficts reviewed.   Pt to attend the Portion Distortion class - met 07/04/12   Pt given handouts for: ? Nutrition I class ? Nutrition II class    Continue client-centered nutrition education by RD, as part of interdisciplinary care.  Goal(s)   Pt to identify food quantities necessary to achieve: ? wt loss to a goal wt of 181-199 lb (82.4-90.6 kg) at graduation from cardiac rehab.   Monitor and Evaluate progress toward nutrition goal with team. Nutrition Risk:  Low   [name], M.Ed, RD, LDN, CDE 07/11/2012 3:34 PM

## 2012-07-11 NOTE — Progress Notes (Signed)
Stephen Stephen Curtis has had some intermittent exertional blood pressure elevations at cardiac rehab. Exit blood pressure today 104/64. Stephen Curtis has an appointment with his family doctor on Friday. Will fax exercise flow sheets to Dr. Flavia Shipper office for review.  Stephen Curtis will be absent on Friday due to Md appointment.

## 2012-07-12 ENCOUNTER — Ambulatory Visit (HOSPITAL_COMMUNITY): Payer: No Typology Code available for payment source

## 2012-07-13 ENCOUNTER — Encounter (HOSPITAL_COMMUNITY)
Admission: RE | Admit: 2012-07-13 | Discharge: 2012-07-13 | Disposition: A | Discharge status: Home / Self Care | Payer: No Typology Code available for payment source | Source: Ambulatory Visit | Attending: Cardiology | Admitting: Cardiology

## 2012-07-13 ENCOUNTER — Encounter (HOSPITAL_COMMUNITY): Payer: No Typology Code available for payment source

## 2012-07-13 DIAGNOSIS — Z5189 Encounter for other specified aftercare: Secondary | ICD-10-CM | POA: Insufficient documentation

## 2012-07-13 DIAGNOSIS — Z9861 Coronary angioplasty status: Secondary | ICD-10-CM | POA: Insufficient documentation

## 2012-07-13 DIAGNOSIS — I2510 Atherosclerotic heart disease of native coronary artery without angina pectoris: Secondary | ICD-10-CM | POA: Insufficient documentation

## 2012-07-13 DIAGNOSIS — Z8249 Family history of ischemic heart disease and other diseases of the circulatory system: Secondary | ICD-10-CM | POA: Insufficient documentation

## 2012-07-13 DIAGNOSIS — Z79899 Other long term (current) drug therapy: Secondary | ICD-10-CM | POA: Insufficient documentation

## 2012-07-13 DIAGNOSIS — I10 Essential (primary) hypertension: Secondary | ICD-10-CM | POA: Insufficient documentation

## 2012-07-13 DIAGNOSIS — E785 Hyperlipidemia, unspecified: Secondary | ICD-10-CM | POA: Insufficient documentation

## 2012-07-13 DIAGNOSIS — I200 Unstable angina: Secondary | ICD-10-CM | POA: Insufficient documentation

## 2012-07-16 ENCOUNTER — Encounter (HOSPITAL_COMMUNITY): Payer: No Typology Code available for payment source

## 2012-07-16 ENCOUNTER — Encounter (HOSPITAL_COMMUNITY)
Admission: RE | Admit: 2012-07-16 | Discharge: 2012-07-16 | Disposition: A | Discharge status: Home / Self Care | Payer: No Typology Code available for payment source | Source: Ambulatory Visit | Attending: Cardiology | Admitting: Cardiology

## 2012-07-18 ENCOUNTER — Encounter (HOSPITAL_COMMUNITY): Payer: No Typology Code available for payment source

## 2012-07-18 ENCOUNTER — Encounter (HOSPITAL_COMMUNITY)
Admission: RE | Admit: 2012-07-18 | Discharge: 2012-07-18 | Disposition: A | Discharge status: Home / Self Care | Payer: No Typology Code available for payment source | Source: Ambulatory Visit | Attending: Cardiology | Admitting: Cardiology

## 2012-07-18 NOTE — Progress Notes (Signed)
1610-9604 Reviewed home exercise guidelines with patient including endpoints, temperature precautions, target heart rate and rate of perceived exertion. Pt plans to walk and goes to [employer] as his mode of home exercise. Pt voices understanding of instructions given.  Cristy Hilts, MS, ACSM CES

## 2012-07-20 ENCOUNTER — Encounter (HOSPITAL_COMMUNITY): Payer: No Typology Code available for payment source

## 2012-07-20 ENCOUNTER — Encounter (HOSPITAL_COMMUNITY)
Admission: RE | Admit: 2012-07-20 | Discharge: 2012-07-20 | Disposition: A | Discharge status: Home / Self Care | Payer: No Typology Code available for payment source | Source: Ambulatory Visit | Attending: Cardiology | Admitting: Cardiology

## 2012-07-23 ENCOUNTER — Encounter (HOSPITAL_COMMUNITY)
Admission: RE | Admit: 2012-07-23 | Discharge: 2012-07-23 | Disposition: A | Discharge status: Home / Self Care | Payer: No Typology Code available for payment source | Source: Ambulatory Visit | Attending: Cardiology | Admitting: Cardiology

## 2012-07-23 ENCOUNTER — Encounter (HOSPITAL_COMMUNITY): Payer: No Typology Code available for payment source

## 2012-07-25 ENCOUNTER — Encounter (HOSPITAL_COMMUNITY)
Admission: RE | Admit: 2012-07-25 | Discharge: 2012-07-25 | Disposition: A | Discharge status: Home / Self Care | Payer: No Typology Code available for payment source | Source: Ambulatory Visit | Attending: Cardiology | Admitting: Cardiology

## 2012-07-25 ENCOUNTER — Encounter (HOSPITAL_COMMUNITY): Payer: No Typology Code available for payment source

## 2012-07-27 ENCOUNTER — Encounter (HOSPITAL_COMMUNITY)
Admission: RE | Admit: 2012-07-27 | Discharge: 2012-07-27 | Disposition: A | Discharge status: Home / Self Care | Payer: No Typology Code available for payment source | Source: Ambulatory Visit | Attending: Cardiology | Admitting: Cardiology

## 2012-07-27 ENCOUNTER — Encounter (HOSPITAL_COMMUNITY): Payer: No Typology Code available for payment source

## 2012-07-30 ENCOUNTER — Encounter (HOSPITAL_COMMUNITY)
Admission: RE | Admit: 2012-07-30 | Discharge: 2012-07-30 | Disposition: A | Discharge status: Home / Self Care | Payer: No Typology Code available for payment source | Source: Ambulatory Visit | Attending: Cardiology | Admitting: Cardiology

## 2012-07-30 ENCOUNTER — Encounter (HOSPITAL_COMMUNITY): Payer: No Typology Code available for payment source

## 2012-08-01 ENCOUNTER — Encounter (HOSPITAL_COMMUNITY): Payer: No Typology Code available for payment source

## 2012-08-01 ENCOUNTER — Encounter (HOSPITAL_COMMUNITY)
Admission: RE | Admit: 2012-08-01 | Discharge: 2012-08-01 | Disposition: A | Discharge status: Home / Self Care | Payer: No Typology Code available for payment source | Source: Ambulatory Visit | Attending: Cardiology | Admitting: Cardiology

## 2012-08-03 ENCOUNTER — Encounter (HOSPITAL_COMMUNITY)
Admission: RE | Admit: 2012-08-03 | Discharge: 2012-08-03 | Disposition: A | Discharge status: Home / Self Care | Payer: No Typology Code available for payment source | Source: Ambulatory Visit | Attending: Cardiology | Admitting: Cardiology

## 2012-08-03 ENCOUNTER — Encounter (HOSPITAL_COMMUNITY): Payer: No Typology Code available for payment source

## 2012-08-06 ENCOUNTER — Encounter (HOSPITAL_COMMUNITY): Payer: No Typology Code available for payment source

## 2012-08-08 ENCOUNTER — Encounter (HOSPITAL_COMMUNITY): Payer: No Typology Code available for payment source

## 2012-08-08 ENCOUNTER — Encounter (HOSPITAL_COMMUNITY)
Admission: RE | Admit: 2012-08-08 | Discharge: 2012-08-08 | Disposition: A | Discharge status: Home / Self Care | Payer: No Typology Code available for payment source | Source: Ambulatory Visit | Attending: Cardiology | Admitting: Cardiology

## 2012-08-10 ENCOUNTER — Encounter (HOSPITAL_COMMUNITY): Payer: No Typology Code available for payment source

## 2012-08-10 ENCOUNTER — Encounter (HOSPITAL_COMMUNITY)
Admission: RE | Admit: 2012-08-10 | Discharge: 2012-08-10 | Disposition: A | Discharge status: Home / Self Care | Payer: No Typology Code available for payment source | Source: Ambulatory Visit | Attending: Cardiology | Admitting: Cardiology

## 2012-08-13 ENCOUNTER — Encounter (HOSPITAL_COMMUNITY)
Admission: RE | Admit: 2012-08-13 | Discharge: 2012-08-13 | Disposition: A | Discharge status: Home / Self Care | Payer: No Typology Code available for payment source | Source: Ambulatory Visit | Attending: Cardiology | Admitting: Cardiology

## 2012-08-13 ENCOUNTER — Encounter (HOSPITAL_COMMUNITY): Payer: No Typology Code available for payment source

## 2012-08-13 DIAGNOSIS — I200 Unstable angina: Secondary | ICD-10-CM | POA: Insufficient documentation

## 2012-08-13 DIAGNOSIS — E785 Hyperlipidemia, unspecified: Secondary | ICD-10-CM | POA: Insufficient documentation

## 2012-08-13 DIAGNOSIS — Z9861 Coronary angioplasty status: Secondary | ICD-10-CM | POA: Insufficient documentation

## 2012-08-13 DIAGNOSIS — Z5189 Encounter for other specified aftercare: Secondary | ICD-10-CM | POA: Insufficient documentation

## 2012-08-13 DIAGNOSIS — I10 Essential (primary) hypertension: Secondary | ICD-10-CM | POA: Insufficient documentation

## 2012-08-13 DIAGNOSIS — Z79899 Other long term (current) drug therapy: Secondary | ICD-10-CM | POA: Insufficient documentation

## 2012-08-13 DIAGNOSIS — Z8249 Family history of ischemic heart disease and other diseases of the circulatory system: Secondary | ICD-10-CM | POA: Insufficient documentation

## 2012-08-13 DIAGNOSIS — I2510 Atherosclerotic heart disease of native coronary artery without angina pectoris: Secondary | ICD-10-CM | POA: Insufficient documentation

## 2012-08-15 ENCOUNTER — Encounter (HOSPITAL_COMMUNITY)
Admission: RE | Admit: 2012-08-15 | Discharge: 2012-08-15 | Disposition: A | Discharge status: Home / Self Care | Payer: No Typology Code available for payment source | Source: Ambulatory Visit | Attending: Cardiology | Admitting: Cardiology

## 2012-08-15 ENCOUNTER — Encounter (HOSPITAL_COMMUNITY): Payer: No Typology Code available for payment source

## 2012-08-17 ENCOUNTER — Encounter (HOSPITAL_COMMUNITY): Payer: No Typology Code available for payment source

## 2012-08-17 ENCOUNTER — Encounter (HOSPITAL_COMMUNITY)
Admission: RE | Admit: 2012-08-17 | Discharge: 2012-08-17 | Disposition: A | Discharge status: Home / Self Care | Payer: No Typology Code available for payment source | Source: Ambulatory Visit | Attending: Cardiology | Admitting: Cardiology

## 2012-08-20 ENCOUNTER — Encounter (HOSPITAL_COMMUNITY)
Admission: RE | Admit: 2012-08-20 | Discharge: 2012-08-20 | Disposition: A | Discharge status: Home / Self Care | Payer: No Typology Code available for payment source | Source: Ambulatory Visit | Attending: Cardiology | Admitting: Cardiology

## 2012-08-20 ENCOUNTER — Encounter (HOSPITAL_COMMUNITY): Payer: No Typology Code available for payment source

## 2012-08-22 ENCOUNTER — Encounter (HOSPITAL_COMMUNITY): Payer: No Typology Code available for payment source

## 2012-08-24 ENCOUNTER — Encounter (HOSPITAL_COMMUNITY): Payer: No Typology Code available for payment source

## 2012-08-24 ENCOUNTER — Encounter (HOSPITAL_COMMUNITY)
Admission: RE | Admit: 2012-08-24 | Discharge: 2012-08-24 | Disposition: A | Discharge status: Home / Self Care | Payer: No Typology Code available for payment source | Source: Ambulatory Visit | Attending: Cardiology | Admitting: Cardiology

## 2012-08-27 ENCOUNTER — Encounter (HOSPITAL_COMMUNITY)
Admission: RE | Admit: 2012-08-27 | Discharge: 2012-08-27 | Disposition: A | Discharge status: Home / Self Care | Payer: No Typology Code available for payment source | Source: Ambulatory Visit | Attending: Cardiology | Admitting: Cardiology

## 2012-08-27 ENCOUNTER — Encounter (HOSPITAL_COMMUNITY): Payer: No Typology Code available for payment source

## 2012-08-29 ENCOUNTER — Encounter (HOSPITAL_COMMUNITY)
Admission: RE | Admit: 2012-08-29 | Discharge: 2012-08-29 | Disposition: A | Discharge status: Home / Self Care | Payer: No Typology Code available for payment source | Source: Ambulatory Visit | Attending: Cardiology | Admitting: Cardiology

## 2012-08-29 ENCOUNTER — Encounter (HOSPITAL_COMMUNITY): Payer: No Typology Code available for payment source

## 2012-08-31 ENCOUNTER — Encounter (HOSPITAL_COMMUNITY)
Admission: RE | Admit: 2012-08-31 | Discharge: 2012-08-31 | Disposition: A | Discharge status: Home / Self Care | Payer: No Typology Code available for payment source | Source: Ambulatory Visit | Attending: Cardiology | Admitting: Cardiology

## 2012-08-31 ENCOUNTER — Encounter (HOSPITAL_COMMUNITY): Payer: No Typology Code available for payment source

## 2012-09-03 ENCOUNTER — Encounter (HOSPITAL_COMMUNITY)
Admission: RE | Admit: 2012-09-03 | Discharge: 2012-09-03 | Disposition: A | Discharge status: Home / Self Care | Payer: No Typology Code available for payment source | Source: Ambulatory Visit | Attending: Cardiology | Admitting: Cardiology

## 2012-09-03 ENCOUNTER — Encounter (HOSPITAL_COMMUNITY): Payer: No Typology Code available for payment source

## 2012-09-05 ENCOUNTER — Encounter (HOSPITAL_COMMUNITY): Payer: No Typology Code available for payment source

## 2012-09-05 ENCOUNTER — Encounter (HOSPITAL_COMMUNITY)
Admission: RE | Admit: 2012-09-05 | Discharge: 2012-09-05 | Disposition: A | Discharge status: Home / Self Care | Payer: No Typology Code available for payment source | Source: Ambulatory Visit | Attending: Cardiology | Admitting: Cardiology

## 2012-09-07 ENCOUNTER — Encounter (HOSPITAL_COMMUNITY): Payer: No Typology Code available for payment source

## 2012-09-10 ENCOUNTER — Encounter (HOSPITAL_COMMUNITY): Payer: No Typology Code available for payment source

## 2012-09-10 ENCOUNTER — Encounter (HOSPITAL_COMMUNITY)
Admission: RE | Admit: 2012-09-10 | Discharge: 2012-09-10 | Disposition: A | Discharge status: Home / Self Care | Payer: No Typology Code available for payment source | Source: Ambulatory Visit | Attending: Cardiology | Admitting: Cardiology

## 2012-09-10 NOTE — Progress Notes (Signed)
Stephen Curtis graduates today. Milas plans to continue exercise on his own.

## 2012-09-12 ENCOUNTER — Encounter (HOSPITAL_COMMUNITY): Payer: No Typology Code available for payment source

## 2012-09-14 ENCOUNTER — Encounter (HOSPITAL_COMMUNITY): Payer: No Typology Code available for payment source

## 2012-09-17 ENCOUNTER — Encounter (HOSPITAL_COMMUNITY): Payer: No Typology Code available for payment source

## 2012-09-19 ENCOUNTER — Encounter (HOSPITAL_COMMUNITY): Payer: No Typology Code available for payment source

## 2012-09-21 ENCOUNTER — Encounter (HOSPITAL_COMMUNITY): Payer: No Typology Code available for payment source

## 2013-01-09 ENCOUNTER — Encounter: Payer: Self-pay | Admitting: Cardiology

## 2013-01-09 ENCOUNTER — Encounter: Payer: Self-pay | Admitting: *Deleted

## 2013-01-10 ENCOUNTER — Ambulatory Visit (INDEPENDENT_AMBULATORY_CARE_PROVIDER_SITE_OTHER): Payer: No Typology Code available for payment source | Admitting: Cardiology

## 2013-01-10 ENCOUNTER — Encounter (INDEPENDENT_AMBULATORY_CARE_PROVIDER_SITE_OTHER): Payer: Self-pay

## 2013-01-10 ENCOUNTER — Ambulatory Visit: Payer: No Typology Code available for payment source | Admitting: Cardiology

## 2013-01-10 ENCOUNTER — Encounter: Payer: Self-pay | Admitting: Cardiology

## 2013-01-10 VITALS — BP 120/80 | HR 74 | Ht 70.0 in | Wt 197.0 lb

## 2013-01-10 DIAGNOSIS — R55 Syncope and collapse: Secondary | ICD-10-CM

## 2013-01-10 DIAGNOSIS — I2510 Atherosclerotic heart disease of native coronary artery without angina pectoris: Secondary | ICD-10-CM

## 2013-01-10 DIAGNOSIS — E785 Hyperlipidemia, unspecified: Secondary | ICD-10-CM

## 2013-01-10 NOTE — Progress Notes (Signed)
[address] [place], [place]  [address] Phone: [phone] Fax:  [phone]  Date:  01/10/2013   ID:  Stephen Curtis, DOB April 27, 1950, MRN [id number]  PCP:  Mickie Hillier, MD  Cardiologist:  Armanda Magic, MD     History of Present Illness: Stephen Curtis is a 62 y.o. male with a history of CAD, dyslipidemia and vasovagal syncope presents today for followup.  He has been doing well.  He denies any chest pain, DOE, SOB, palpitations, dizziness, LE edema or syncope.  He has had no more problems with vasovagal episodes.  He has completed Cardiac Rehab.  He walks for exercise and lifts weights.   Wt Readings from Last 3 Encounters:  01/10/13 197 lb (89.359 kg)  06/20/12 200 lb 3.2 oz (90.81 kg)  06/14/12 205 lb 11 oz (93.3 kg)     Past Medical History  Diagnosis Date  . BPH (benign prostatic hypertrophy)   . Hyperlipemia   . Vertigo     hearing loss left ear evaluated by ENT Dr. Jearld Fenton  . Retinopathy   . Vasovagal syncope   . CAD (coronary artery disease)     subtotalled RCA wth corderline 50-70% mid LAD s/p DES to RCA    Current Outpatient Prescriptions  Medication Sig Dispense Refill  . aspirin EC 81 MG tablet Take 81 mg by mouth daily.      Marland Kitchen atorvastatin (LIPITOR) 40 MG tablet Take 1 tablet (40 mg total) by mouth daily.  30 tablet  11  . cetirizine (ZYRTEC) 10 MG tablet Take 10 mg by mouth daily.      . fish oil-omega-3 fatty acids 1000 MG capsule Take 500 mg by mouth daily.       . Multiple Vitamin (MULTIVITAMIN WITH MINERALS) TABS Take 1 tablet by mouth daily.      . nitroGLYCERIN (NITROSTAT) 0.4 MG SL tablet Place 1 tablet (0.4 mg total) under the tongue every 5 (five) minutes x 3 doses as needed for chest pain.  25 tablet  3  . Ticagrelor (BRILINTA) 90 MG TABS tablet Take 1 tablet (90 mg total) by mouth 2 (two) times daily.  60 tablet  11   No current facility-administered medications for this visit.    Allergies:    Allergies  Allergen  Reactions  . Shrimp [Shellfish Allergy] Swelling    Social History:  The patient  reports that he has never smoked. He has never used smokeless tobacco. He reports that he drinks alcohol. He reports that he does not use illicit drugs.   Family History:  The patient's family history includes Cancer in his father; Colon cancer in his sister; Diabetes in an other family member; Heart attack in his mother; Heart disease in his mother; Hypertension in an other family member.   ROS:  Please see the history of present illness.      All other systems reviewed and negative.   PHYSICAL EXAM: VS:  BP 120/80  Pulse 74  Ht 5\' 10"  (1.778 m)  Wt 197 lb (89.359 kg)  BMI 28.27 kg/m2  SpO2 97% Well nourished, well developed, in no acute distress HEENT: normal Neck: no JVD Cardiac:  normal S1, S2; RRR; no murmur Lungs:  clear to auscultation bilaterally, no wheezing, rhonchi or rales Abd: soft, nontender, no hepatomegaly Ext: no edema Skin: warm and dry Neuro:  CNs 2-12 intact, no focal abnormalities noted       ASSESSMENT AND PLAN:  1. ASCAD with no  angina  - continue ASA/Brilinta 2. Dyslipidemia - his last NMR panel should good LDL particle number but too many of the small LDL particles  - continue Fish Oil/Atorvastatin  - Recheck NMR since he has been exercising more.  If small LDL particle number still too high then will send to lipid clinic 3. Vasovagal syncope with no reoccurence  Follow up with me in 6 months.  Signed, Armanda Magic, MD 01/10/2013 8:36 AM

## 2013-01-10 NOTE — Patient Instructions (Signed)
Your physician recommends that you return for lab work on: November 21st for your NMR Lipoprofile (fasting)  Your physician wants you to follow-up in: 6 Months with Dr. Sherlyn Lick will receive a reminder letter in the mail two months in advance. If you don't receive a letter, please call our office to schedule the follow-up appointment.

## 2013-01-17 ENCOUNTER — Other Ambulatory Visit: Payer: Self-pay

## 2013-02-01 ENCOUNTER — Other Ambulatory Visit: Payer: No Typology Code available for payment source

## 2013-02-01 DIAGNOSIS — E785 Hyperlipidemia, unspecified: Secondary | ICD-10-CM

## 2013-02-04 LAB — NMR LIPOPROFILE WITH LIPIDS
HDL Particle Number: 30.2 umol/L — ABNORMAL LOW (ref >=30.5)
LDL (calc): 57 mg/dL (ref <100)
LDL Particle Number: 671 nmol/L (ref <1000)
LDL Size: 20.5 nm — ABNORMAL LOW (ref >20.5)
LP-IR Score: 37 (ref <=45)
Large VLDL-P: 0.8 nmol/L (ref <=2.7)
Small LDL Particle Number: 257 nmol/L (ref <=527)

## 2013-02-11 ENCOUNTER — Encounter: Payer: Self-pay | Admitting: General Surgery

## 2013-02-11 ENCOUNTER — Other Ambulatory Visit: Payer: Self-pay | Admitting: General Surgery

## 2013-02-11 DIAGNOSIS — E785 Hyperlipidemia, unspecified: Secondary | ICD-10-CM

## 2013-02-11 DIAGNOSIS — Z79899 Other long term (current) drug therapy: Secondary | ICD-10-CM

## 2013-02-11 NOTE — Progress Notes (Signed)
Letter sent to pt to make aware. Also opened on mycharts for pt.

## 2013-03-31 ENCOUNTER — Other Ambulatory Visit: Payer: Self-pay | Admitting: *Deleted

## 2013-03-31 DIAGNOSIS — E785 Hyperlipidemia, unspecified: Secondary | ICD-10-CM

## 2013-03-31 DIAGNOSIS — Z79899 Other long term (current) drug therapy: Secondary | ICD-10-CM

## 2013-04-17 ENCOUNTER — Ambulatory Visit (INDEPENDENT_AMBULATORY_CARE_PROVIDER_SITE_OTHER): Payer: BC Managed Care – PPO | Admitting: *Deleted

## 2013-04-17 ENCOUNTER — Encounter: Payer: Self-pay | Admitting: General Surgery

## 2013-04-17 DIAGNOSIS — E785 Hyperlipidemia, unspecified: Secondary | ICD-10-CM

## 2013-04-17 DIAGNOSIS — Z79899 Other long term (current) drug therapy: Secondary | ICD-10-CM

## 2013-04-17 DIAGNOSIS — I2510 Atherosclerotic heart disease of native coronary artery without angina pectoris: Secondary | ICD-10-CM

## 2013-04-17 LAB — ALT: ALT: 29 U/L (ref 0–53)

## 2013-04-17 LAB — LIPID PANEL
CHOLESTEROL: 99 mg/dL (ref 0–200)
HDL: 36.3 mg/dL — ABNORMAL LOW (ref >39.00)
LDL Cholesterol: 50 mg/dL (ref 0–99)
TRIGLYCERIDES: 63 mg/dL (ref 0.0–149.0)
Total CHOL/HDL Ratio: 3
VLDL: 12.6 mg/dL (ref 0.0–40.0)

## 2013-04-17 LAB — LDL CHOLESTEROL, DIRECT: Direct LDL: 58.4 mg/dL

## 2013-05-02 ENCOUNTER — Other Ambulatory Visit: Payer: Self-pay | Admitting: *Deleted

## 2013-05-02 MED ORDER — TICAGRELOR 90 MG PO TABS: 90.0000 mg | ORAL_TABLET | Freq: Two times a day (BID) | ORAL | Status: DC

## 2013-05-02 MED ORDER — ATORVASTATIN CALCIUM 40 MG PO TABS: 40.0000 mg | ORAL_TABLET | Freq: Every day | ORAL | Status: DC

## 2013-05-08 ENCOUNTER — Telehealth: Payer: Self-pay

## 2013-05-08 ENCOUNTER — Telehealth: Payer: Self-pay | Admitting: Cardiology

## 2013-05-08 NOTE — Telephone Encounter (Signed)
lvm for pt to return call °

## 2013-05-08 NOTE — Telephone Encounter (Signed)
Patient called wanting to know if he stills need to take brilintia

## 2013-05-08 NOTE — Telephone Encounter (Signed)
Ok to stop Brilinta 05/17/2013 and continue ASA.  His PCI with DES was 05/17/2012

## 2013-05-08 NOTE — Telephone Encounter (Signed)
To Dr Mayford Knifeurner to review. Last seen in office 12/2012

## 2013-05-08 NOTE — Telephone Encounter (Signed)
New message   Patient calling wants to know when should he stop taken brilinta

## 2013-05-10 ENCOUNTER — Other Ambulatory Visit: Payer: Self-pay | Admitting: General Surgery

## 2013-05-10 NOTE — Telephone Encounter (Signed)
Please see telephone encounter form 2/25

## 2013-05-10 NOTE — Telephone Encounter (Signed)
Pt knows to stop on 05/17/13.

## 2013-05-10 NOTE — Telephone Encounter (Signed)
Pt is aware and Updated medication list.

## 2013-06-21 ENCOUNTER — Encounter: Payer: Self-pay | Admitting: Cardiology

## 2013-07-05 ENCOUNTER — Other Ambulatory Visit: Payer: Self-pay | Admitting: Cardiology

## 2013-08-20 ENCOUNTER — Other Ambulatory Visit: Payer: Self-pay | Admitting: *Deleted

## 2013-08-20 MED ORDER — ATORVASTATIN CALCIUM 40 MG PO TABS: ORAL_TABLET | ORAL | Status: DC

## 2013-10-03 ENCOUNTER — Encounter: Payer: Self-pay | Admitting: Podiatry

## 2013-10-03 ENCOUNTER — Ambulatory Visit (INDEPENDENT_AMBULATORY_CARE_PROVIDER_SITE_OTHER): Payer: BC Managed Care – PPO | Admitting: Podiatry

## 2013-10-03 ENCOUNTER — Ambulatory Visit (INDEPENDENT_AMBULATORY_CARE_PROVIDER_SITE_OTHER): Payer: BC Managed Care – PPO

## 2013-10-03 VITALS — BP 134/80 | HR 60 | Resp 16 | Ht 70.0 in | Wt 200.0 lb

## 2013-10-03 DIAGNOSIS — M779 Enthesopathy, unspecified: Secondary | ICD-10-CM

## 2013-10-03 DIAGNOSIS — M2021 Hallux rigidus, right foot: Secondary | ICD-10-CM

## 2013-10-03 DIAGNOSIS — M2020 Hallux rigidus, unspecified foot: Secondary | ICD-10-CM

## 2013-10-03 MED ORDER — DICLOFENAC SODIUM 75 MG PO TBEC: 75.0000 mg | DELAYED_RELEASE_TABLET | Freq: Two times a day (BID) | ORAL | Status: DC

## 2013-10-03 MED ORDER — TRIAMCINOLONE ACETONIDE 10 MG/ML IJ SUSP: 10.0000 mg | Freq: Once | INTRAMUSCULAR | Status: AC

## 2013-10-03 NOTE — Progress Notes (Signed)
   Subjective:    Patient ID: Stephen Curtis, male    DOB: 03/15/1950, 63 y.o.   MRN: [id number]  HPI Comments: "I have some discomfort in the right foot"  Patient c/o aching forefoot and 1st toe right for a couple months. He has episodes of pain. He notices that its worse after doing yard work that involved squatting and bending the toes. Walking barefoot aggravates it. Tried pain spray and wrap the 1st and 2nd toes together and Tylenol.   Foot Pain      Review of Systems  Musculoskeletal: Positive for gait problem.  All other systems reviewed and are negative.      Objective:   Physical Exam        Assessment & Plan:

## 2013-10-04 NOTE — Progress Notes (Signed)
Subjective:     Patient ID: Stephen Curtis, male   DOB: 04/01/1950, 63 y.o.   MRN: [id number]  Foot Pain   patient presents stating I'm having a lot of pain in his big toe joint right after having done a lot of cleaning and also it's been bother me for about a year I been noticing it at the joint doesn't bend well   Review of Systems  All other systems reviewed and are negative.      Objective:   Physical Exam  Nursing note and vitals reviewed. Constitutional: He is oriented to person, place, and time.  Cardiovascular: Intact distal pulses.   Musculoskeletal: Normal range of motion.  Neurological: He is oriented to person, place, and time.  Skin: Skin is warm.   neurovascular status is found to be intact and range of motion subtalar joint midtarsal joint is normal. Patient has significant loss of motion first MPJ right with pain in the joint and inability to bend and has mild loss of motion left but nowhere near as extensive as the right patient's digits are well-perfused and arch height is somewhat diminished     Assessment:     Patient had has most likely hallux limitus deformity with inflammation around the first MPJ right foot    Plan:     H&P and x-ray reviewed and careful injection around the joint accomplished 3 mg Kenalog 5 mg I can Marcaine mixture and advised on physical therapy rigid bottom shoes and reappoint if symptoms were to continue

## 2013-10-15 ENCOUNTER — Other Ambulatory Visit (INDEPENDENT_AMBULATORY_CARE_PROVIDER_SITE_OTHER): Payer: BC Managed Care – PPO

## 2013-10-15 DIAGNOSIS — Z79899 Other long term (current) drug therapy: Secondary | ICD-10-CM

## 2013-10-15 DIAGNOSIS — E785 Hyperlipidemia, unspecified: Secondary | ICD-10-CM

## 2013-10-15 LAB — HEPATIC FUNCTION PANEL
ALT: 44 U/L (ref 0–53)
AST: 27 U/L (ref 0–37)
Albumin: 4.3 g/dL (ref 3.5–5.2)
Alkaline Phosphatase: 52 U/L (ref 39–117)
Bilirubin, Direct: 0.1 mg/dL (ref 0.0–0.3)
TOTAL PROTEIN: 6.9 g/dL (ref 6.0–8.3)
Total Bilirubin: 0.8 mg/dL (ref 0.2–1.2)

## 2013-10-15 LAB — LIPID PANEL
CHOL/HDL RATIO: 3
CHOLESTEROL: 105 mg/dL (ref 0–200)
HDL: 34.3 mg/dL — ABNORMAL LOW (ref >39.00)
LDL CALC: 62 mg/dL (ref 0–99)
NonHDL: 70.7
TRIGLYCERIDES: 45 mg/dL (ref 0.0–149.0)
VLDL: 9 mg/dL (ref 0.0–40.0)

## 2013-10-16 ENCOUNTER — Other Ambulatory Visit: Payer: Self-pay | Admitting: General Surgery

## 2013-10-16 DIAGNOSIS — E785 Hyperlipidemia, unspecified: Secondary | ICD-10-CM

## 2013-10-24 ENCOUNTER — Other Ambulatory Visit: Payer: Self-pay | Admitting: Cardiology

## 2013-10-31 ENCOUNTER — Encounter: Payer: Self-pay | Admitting: Cardiology

## 2013-10-31 ENCOUNTER — Ambulatory Visit (INDEPENDENT_AMBULATORY_CARE_PROVIDER_SITE_OTHER): Payer: BC Managed Care – PPO | Admitting: Cardiology

## 2013-10-31 VITALS — BP 122/88 | HR 63 | Ht 70.0 in | Wt 201.0 lb

## 2013-10-31 DIAGNOSIS — I2510 Atherosclerotic heart disease of native coronary artery without angina pectoris: Secondary | ICD-10-CM

## 2013-10-31 DIAGNOSIS — E785 Hyperlipidemia, unspecified: Secondary | ICD-10-CM

## 2013-10-31 DIAGNOSIS — R55 Syncope and collapse: Secondary | ICD-10-CM

## 2013-10-31 NOTE — Patient Instructions (Signed)
Your physician recommends that you continue on your current medications as directed. Please refer to the Current Medication list given to you today.  Your physician wants you to follow-up in: 6 months with Dr Turner You will receive a reminder letter in the mail two months in advance. If you don't receive a letter, please call our office to schedule the follow-up appointment.  

## 2013-10-31 NOTE — Progress Notes (Signed)
[address] [place], [place]  [address] Phone: [phone] Fax:  [phone]  Date:  10/31/2013   ID:  MAJID MCCRAVY, DOB 13-Jul-1950, MRN [id number]  PCP:  Mickie Hillier, MD  Cardiologist:  Armanda Magic, MD     History of Present Illness: Stephen Curtis is a 63 y.o. male with a history of CAD, dyslipidemia and vasovagal syncope presents today for followup. He has been doing well. He denies any chest pain, DOE, SOB, palpitations, dizziness, LE edema or syncope. He has had no more problems with vasovagal episodes. He walks for exercise and lifts weights.   Wt Readings from Last 3 Encounters:  10/31/13 201 lb (91.173 kg)  10/03/13 200 lb (90.719 kg)  01/10/13 197 lb (89.359 kg)     Past Medical History  Diagnosis Date  . BPH (benign prostatic hypertrophy)   . Hyperlipemia   . Vertigo     hearing loss left ear evaluated by ENT Dr. Jearld Fenton  . Retinopathy   . Vasovagal syncope   . CAD (coronary artery disease)     subtotalled RCA wth corderline 50-70% mid LAD s/p DES to RCA    Current Outpatient Prescriptions  Medication Sig Dispense Refill  . aspirin EC 81 MG tablet Take 81 mg by mouth daily.      Marland Kitchen atorvastatin (LIPITOR) 40 MG tablet TAKE 1 TABLET BY MOUTH ONCE DAILY  30 tablet  0  . cetirizine (ZYRTEC) 10 MG tablet Take 10 mg by mouth daily.      . fish oil-omega-3 fatty acids 1000 MG capsule Take 500 mg by mouth daily.       . Multiple Vitamin (MULTIVITAMIN WITH MINERALS) TABS Take 1 tablet by mouth daily.      . nitroGLYCERIN (NITROSTAT) 0.4 MG SL tablet Place 1 tablet (0.4 mg total) under the tongue every 5 (five) minutes x 3 doses as needed for chest pain.  25 tablet  3   Current Facility-Administered Medications  Medication Dose Route Frequency Provider Last Rate Last Dose  . triamcinolone acetonide (KENALOG) 10 MG/ML injection 10 mg  10 mg Other Once Lenn Sink, DPM        Allergies:    Allergies  Allergen Reactions  . Shrimp  [Shellfish Allergy] Swelling    Social History:  The patient  reports that he has never smoked. He has never used smokeless tobacco. He reports that he drinks alcohol. He reports that he does not use illicit drugs.   Family History:  The patient's family history includes Cancer in his father; Colon cancer in his sister; Diabetes in an other family member; Heart attack in his mother; Heart disease in his mother; Hypertension in an other family member.   ROS:  Please see the history of present illness.      All other systems reviewed and negative.   PHYSICAL EXAM: VS:  BP 122/88  Pulse 63  Ht 5\' 10"  (1.778 m)  Wt 201 lb (91.173 kg)  BMI 28.84 kg/m2 Well nourished, well developed, in no acute distress HEENT: normal Neck: no JVD Cardiac:  normal S1, S2; RRR; no murmur Lungs:  clear to auscultation bilaterally, no wheezing, rhonchi or rales Abd: soft, nontender, no hepatomegaly Ext: no edema Skin: warm and dry Neuro:  CNs 2-12 intact, no focal abnormalities noted with no ST changes  EKG:  NSR at 63bpm  ASSESSMENT AND PLAN:  1. ASCAD with no angina - continue ASA  2.  Dyslipidemia - LDL 62 at goal.  HDL decreased at 34 and I have encouraged him to walk - continue Fish Oil/Atorvastatin  3. Vasovagal syncope with no reoccurence  Follow up with me in 6 months.    Signed, Armanda Magicraci Deeana Atwater, MD 10/31/2013 4:00 PM

## 2013-11-22 ENCOUNTER — Other Ambulatory Visit: Payer: Self-pay | Admitting: Cardiology

## 2013-12-09 ENCOUNTER — Other Ambulatory Visit: Payer: Self-pay | Admitting: *Deleted

## 2013-12-09 MED ORDER — NITROGLYCERIN 0.4 MG SL SUBL: 0.4000 mg | SUBLINGUAL_TABLET | SUBLINGUAL | Status: DC | PRN

## 2014-02-11 ENCOUNTER — Other Ambulatory Visit: Payer: No Typology Code available for payment source

## 2014-02-20 ENCOUNTER — Encounter (HOSPITAL_COMMUNITY): Payer: Self-pay | Admitting: Interventional Cardiology

## 2014-02-24 ENCOUNTER — Encounter: Payer: Self-pay | Admitting: Cardiology

## 2014-03-03 ENCOUNTER — Other Ambulatory Visit: Payer: Self-pay | Admitting: Podiatry

## 2014-03-26 ENCOUNTER — Other Ambulatory Visit: Payer: Self-pay | Admitting: Cardiology

## 2014-04-18 ENCOUNTER — Other Ambulatory Visit (INDEPENDENT_AMBULATORY_CARE_PROVIDER_SITE_OTHER): Payer: BLUE CROSS/BLUE SHIELD | Admitting: *Deleted

## 2014-04-18 DIAGNOSIS — E785 Hyperlipidemia, unspecified: Secondary | ICD-10-CM

## 2014-04-18 LAB — HEPATIC FUNCTION PANEL
ALBUMIN: 4.3 g/dL (ref 3.5–5.2)
ALT: 30 U/L (ref 0–53)
AST: 19 U/L (ref 0–37)
Alkaline Phosphatase: 45 U/L (ref 39–117)
BILIRUBIN TOTAL: 0.5 mg/dL (ref 0.2–1.2)
Bilirubin, Direct: 0.1 mg/dL (ref 0.0–0.3)
Total Protein: 6.6 g/dL (ref 6.0–8.3)

## 2014-04-18 LAB — LIPID PANEL
CHOL/HDL RATIO: 3
Cholesterol: 111 mg/dL (ref 0–200)
HDL: 37.3 mg/dL — AB (ref >39.00)
LDL Cholesterol: 62 mg/dL (ref 0–99)
NONHDL: 73.7
TRIGLYCERIDES: 61 mg/dL (ref 0.0–149.0)
VLDL: 12.2 mg/dL (ref 0.0–40.0)

## 2014-04-18 NOTE — Addendum Note (Signed)
Addended by: Tonita PhoenixBOWDEN, ROBIN K on: 04/18/2014 08:19 AM   Modules accepted: Orders

## 2014-04-26 ENCOUNTER — Other Ambulatory Visit: Payer: Self-pay | Admitting: Cardiology

## 2014-05-02 ENCOUNTER — Encounter: Payer: Self-pay | Admitting: Cardiology

## 2014-05-02 ENCOUNTER — Ambulatory Visit (INDEPENDENT_AMBULATORY_CARE_PROVIDER_SITE_OTHER): Payer: BLUE CROSS/BLUE SHIELD | Admitting: Cardiology

## 2014-05-02 VITALS — BP 130/80 | HR 67 | Ht 70.0 in | Wt 206.2 lb

## 2014-05-02 DIAGNOSIS — E785 Hyperlipidemia, unspecified: Secondary | ICD-10-CM

## 2014-05-02 DIAGNOSIS — I2510 Atherosclerotic heart disease of native coronary artery without angina pectoris: Secondary | ICD-10-CM

## 2014-05-02 DIAGNOSIS — I2583 Coronary atherosclerosis due to lipid rich plaque: Principal | ICD-10-CM

## 2014-05-02 DIAGNOSIS — R55 Syncope and collapse: Secondary | ICD-10-CM

## 2014-05-02 NOTE — Patient Instructions (Signed)
Your physician recommends that you continue on your current medications as directed. Please refer to the Current Medication list given to you today.  Your physician wants you to follow-up in: 1 year with Dr. Turner. You will receive a reminder letter in the mail two months in advance. If you don't receive a letter, please call our office to schedule the follow-up appointment.  

## 2014-05-02 NOTE — Progress Notes (Signed)
Cardiology Office Note   Date:  05/02/2014   ID:  Stephen Curtis, DOB 05/09/1950, MRN [id number]  PCP:  Mickie HillierLITTLE,KEVIN LORNE, MD  Cardiologist:   Quintella ReichertURNER,TRACI R, MD   Chief Complaint  Patient presents with  . Coronary Artery Disease  . Hyperlipidemia      History of Present Illness: Stephen MajorsRichard C Curtis is a 64 y.o. male with a history of CAD, dyslipidemia and vasovagal syncope presents today for followup. He has been doing well. He denies any chest pain, DOE, SOB, palpitations, dizziness, LE edema or syncope. He has had no more problems with vasovagal episodes. He walks for exercise and lifts weights and goes to the gym 3-4 times weekly and runs on the treadmill.     Past Medical History  Diagnosis Date  . BPH (benign prostatic hypertrophy)   . Hyperlipemia   . Vertigo     hearing loss left ear evaluated by ENT Dr. Jearld FentonByers  . Retinopathy   . Vasovagal syncope   . CAD (coronary artery disease)     subtotalled RCA wth corderline 50-70% mid LAD s/p DES to RCA    Past Surgical History  Procedure Laterality Date  . Cardiac catheterization    . Knee arthroscopic Left   . Flexible sigmoidoscopy    . Left heart catheterization with coronary angiogram N/A 05/17/2012    Procedure: LEFT HEART CATHETERIZATION WITH CORONARY ANGIOGRAM;  Surgeon: Lesleigh NoeHenry W Smith III, MD;  [facility];  Service: Cardiovascular;  Laterality: N/A;     Current Outpatient Prescriptions  Medication Sig Dispense Refill  . aspirin EC 81 MG tablet Take 81 mg by mouth daily.    Marland Kitchen. atorvastatin (LIPITOR) 40 MG tablet TAKE 1 TABLET BY MOUTH ONCE DAILY 30 tablet 0  . cetirizine (ZYRTEC) 10 MG tablet Take 10 mg by mouth daily.    . diclofenac (VOLTAREN) 75 MG EC tablet TAKE 1 TABLET BY MOUTH TWICE A DAY 60 tablet 1  . fish oil-omega-3 fatty acids 1000 MG capsule Take 500 mg by mouth daily.     . Multiple Vitamin (MULTIVITAMIN WITH MINERALS) TABS Take 1 tablet by mouth daily.    . nitroGLYCERIN  (NITROSTAT) 0.4 MG SL tablet Place 1 tablet (0.4 mg total) under the tongue every 5 (five) minutes x 3 doses as needed for chest pain. 25 tablet 3   Current Facility-Administered Medications  Medication Dose Route Frequency Provider Last Rate Last Dose  . triamcinolone acetonide (KENALOG) 10 MG/ML injection 10 mg  10 mg Other Once Lenn SinkNorman S Regal, DPM        Allergies:   Shrimp    Social History:  The patient  reports that he has never smoked. He has never used smokeless tobacco. He reports that he drinks alcohol. He reports that he does not use illicit drugs.   Family History:  The patient's family history includes Cancer in his father; Colon cancer in his sister; Diabetes in an other family member; Heart attack in his mother; Heart disease in his mother; Hypertension in an other family member.    ROS:  Please see the history of present illness.   Otherwise, review of systems are positive for none.   All other systems are reviewed and negative.    PHYSICAL EXAM: VS:  BP 130/80 mmHg  Pulse 67  Ht 5\' 10"  (1.778 m)  Wt 206 lb 3.2 oz (93.532 kg)  BMI 29.59 kg/m2  SpO2 98% , BMI Body mass index is 29.59 kg/(m^2).  GEN: Well nourished, well developed, in no acute distress HEENT: normal Neck: no JVD, carotid bruits, or masses Cardiac: RRR; no murmurs, rubs, or gallops,no edema  Respiratory:  clear to auscultation bilaterally, normal work of breathing GI: soft, nontender, nondistended, + BS MS: no deformity or atrophy Skin: warm and dry, no rash Neuro:  Strength and sensation are intact Psych: euthymic mood, full affect   EKG:  EKG is not ordered today.    Recent Labs: 04/18/2014: ALT 30    Lipid Panel    Component Value Date/Time   CHOL 111 04/18/2014 0819   TRIG 61.0 04/18/2014 0819   TRIG 45 02/01/2013 0800   HDL 37.30* 04/18/2014 0819   CHOLHDL 3 04/18/2014 0819   VLDL 12.2 04/18/2014 0819   LDLCALC 62 04/18/2014 0819   LDLCALC 57 02/01/2013 0800   LDLDIRECT 58.4  04/17/2013 0739      Wt Readings from Last 3 Encounters:  05/02/14 206 lb 3.2 oz (93.532 kg)  10/31/13 201 lb (91.173 kg)  10/03/13 200 lb (90.719 kg)    ASSESSMENT AND PLAN:  1. ASCAD with no angina - continue ASA  2. Dyslipidemia - LDL 62 at goal. HDL increased to 37 and I have encouraged him to continue in his exercise program - continue Fish Oil/Atorvastatin  3. Vasovagal syncope with no reoccurence    Current medicines are reviewed at length with the patient today.  The patient does not have concerns regarding medicines.  The following changes have been made:  no change  Labs/ tests ordered today include: none  No orders of the defined types were placed in this encounter.     Disposition:   FU with me in 6 months   Signed, Quintella Reichert, MD  05/02/2014 4:06 PM    [facility] [address] [place], [place], [place]  [address] Phone: [phone]; Fax: [phone]

## 2014-05-28 ENCOUNTER — Other Ambulatory Visit: Payer: Self-pay | Admitting: Adult Health

## 2014-07-17 ENCOUNTER — Other Ambulatory Visit: Payer: Self-pay | Admitting: Podiatry

## 2014-07-24 ENCOUNTER — Other Ambulatory Visit: Payer: Self-pay | Admitting: Podiatry

## 2014-08-22 ENCOUNTER — Encounter: Payer: Self-pay | Admitting: Podiatry

## 2014-08-22 ENCOUNTER — Ambulatory Visit (INDEPENDENT_AMBULATORY_CARE_PROVIDER_SITE_OTHER): Payer: BLUE CROSS/BLUE SHIELD | Admitting: Podiatry

## 2014-08-22 ENCOUNTER — Ambulatory Visit (INDEPENDENT_AMBULATORY_CARE_PROVIDER_SITE_OTHER): Payer: BLUE CROSS/BLUE SHIELD

## 2014-08-22 VITALS — BP 159/81 | HR 63 | Resp 16

## 2014-08-22 DIAGNOSIS — M779 Enthesopathy, unspecified: Secondary | ICD-10-CM

## 2014-08-22 DIAGNOSIS — R52 Pain, unspecified: Secondary | ICD-10-CM

## 2014-08-22 DIAGNOSIS — M2021 Hallux rigidus, right foot: Secondary | ICD-10-CM

## 2014-08-22 MED ORDER — TRIAMCINOLONE ACETONIDE 10 MG/ML IJ SUSP
10.0000 mg | Freq: Once | INTRAMUSCULAR | Status: AC
  Administered 2014-08-22: 10 mg

## 2014-08-22 MED ORDER — DICLOFENAC SODIUM 75 MG PO TBEC: 75.0000 mg | DELAYED_RELEASE_TABLET | Freq: Two times a day (BID) | ORAL | Status: DC

## 2014-08-25 NOTE — Progress Notes (Signed)
Subjective:     Patient ID: Stephen Curtis, male   DOB: 09-Dec-1950, 64 y.o.   MRN: [id number]  HPI patient states he was doing good for a while but he's had a reoccurrence of pain on the right foot and the joint is bothering him some and he wants to see if we can do another injection   Review of Systems     Objective:   Physical Exam Neurovascular status intact no muscular changes noted with patient noted to have inflammation around the right hallux with fluid buildup on the lateral side with pain when pressed    Assessment:     Inflammatory changes consistent with capsulitis of the first MPJ right foot    Plan:     Advised on physical therapy and rigid bottom shoes and reinjected the first MPJ 3 mg Kenalog 5 mg Xylocaine and advised on physical therapy

## 2015-02-25 ENCOUNTER — Telehealth: Payer: Self-pay | Admitting: Cardiology

## 2015-02-25 DIAGNOSIS — E785 Hyperlipidemia, unspecified: Secondary | ICD-10-CM

## 2015-02-25 NOTE — Telephone Encounter (Signed)
New problem ° ° °Pt want to know if he need labs before his appt. Please advise °

## 2015-02-26 NOTE — Telephone Encounter (Signed)
FLP and ALT 

## 2015-02-27 NOTE — Telephone Encounter (Signed)
Spoke with pt and informed him that Dr. Mayford Knifeurner would like FLP and ALT labs prior to appt in March. Scheduled labs for 05/05/14. Pt verbalized understanding and was in agreement with this plan.

## 2015-02-27 NOTE — Telephone Encounter (Signed)
Left message to call back  

## 2015-04-18 ENCOUNTER — Other Ambulatory Visit: Payer: Self-pay | Admitting: Cardiology

## 2015-05-06 ENCOUNTER — Other Ambulatory Visit: Payer: BLUE CROSS/BLUE SHIELD

## 2015-05-14 ENCOUNTER — Ambulatory Visit: Payer: BLUE CROSS/BLUE SHIELD | Admitting: Cardiology

## 2015-05-28 ENCOUNTER — Other Ambulatory Visit: Payer: Self-pay | Admitting: Cardiology

## 2015-06-08 ENCOUNTER — Other Ambulatory Visit (INDEPENDENT_AMBULATORY_CARE_PROVIDER_SITE_OTHER): Payer: BLUE CROSS/BLUE SHIELD | Admitting: *Deleted

## 2015-06-08 DIAGNOSIS — E785 Hyperlipidemia, unspecified: Secondary | ICD-10-CM | POA: Diagnosis not present

## 2015-06-08 LAB — LIPID PANEL
Cholesterol: 127 mg/dL (ref 125–200)
HDL: 38 mg/dL — ABNORMAL LOW (ref >=40)
LDL CALC: 80 mg/dL (ref <130)
TRIGLYCERIDES: 44 mg/dL (ref <150)
Total CHOL/HDL Ratio: 3.3 Ratio (ref <=5.0)
VLDL: 9 mg/dL (ref <30)

## 2015-06-08 LAB — ALT: ALT: 28 U/L (ref 9–46)

## 2015-06-09 ENCOUNTER — Other Ambulatory Visit: Payer: BLUE CROSS/BLUE SHIELD

## 2015-06-09 ENCOUNTER — Telehealth: Payer: Self-pay | Admitting: *Deleted

## 2015-06-09 DIAGNOSIS — E785 Hyperlipidemia, unspecified: Secondary | ICD-10-CM

## 2015-06-09 MED ORDER — ATORVASTATIN CALCIUM 80 MG PO TABS: 80.0000 mg | ORAL_TABLET | Freq: Every day | ORAL | Status: DC

## 2015-06-09 NOTE — Telephone Encounter (Signed)
-----   Message from Quintella Reichertraci R Turner, MD sent at 06/08/2015  9:49 PM EDT ----- Increase atorvastatin to 80mg  daily and recheck FLP and ALT in 6 weeks

## 2015-06-09 NOTE — Telephone Encounter (Signed)
Notes Recorded by Lendon KaMichalene Keishawna Carranza, RN on 06/09/2015 at 10:10 AM Patient informed.  Verbalizes understanding and agreement. He has appointment on 06/16/15; will wait until that time to schedule the follow up labs.

## 2015-06-15 ENCOUNTER — Encounter: Payer: Self-pay | Admitting: Cardiology

## 2015-06-15 NOTE — Progress Notes (Addendum)
Cardiology Office Note    Date:  06/16/2015   ID:  Stephen Curtis, DOB 05-13-1950, MRN [id number]  PCP:  Mickie Hillier, MD  Cardiologist:  Quintella Reichert, MD   Chief Complaint  Patient presents with  . Coronary Artery Disease  . Hyperlipidemia    History of Present Illness:  Stephen Curtis is a 65 y.o. male with a history of CAD, dyslipidemia and vasovagal syncope presents today for followup. He has been doing well. He denies any chest pain, DOE, SOB, palpitations, dizziness, LE edema or syncope. He has had no more problems with vasovagal episodes. He walks for exercise  and goes to the gym 3-4 times weekly and runs on the treadmill.     Past Medical History  Diagnosis Date  . BPH (benign prostatic hypertrophy)   . Hyperlipidemia LDL goal <70   . Vertigo     hearing loss left ear evaluated by ENT Dr. Jearld Fenton  . Retinopathy   . Vasovagal syncope   . CAD (coronary artery disease)     subtotalled RCA wth borderline 50-70% mid LAD s/p DES to RCA    Past Surgical History  Procedure Laterality Date  . Cardiac catheterization    . Knee arthroscopic Left   . Flexible sigmoidoscopy    . Left heart catheterization with coronary angiogram N/A 05/17/2012    Procedure: LEFT HEART CATHETERIZATION WITH CORONARY ANGIOGRAM;  Surgeon: Lesleigh Noe, MD;  Location: [facility] C[facility];  Service: Cardiovascular;  Laterality: N/A;    Current Medications: Outpatient Prescriptions Prior to Visit  Medication Sig Dispense Refill  . aspirin EC 81 MG tablet Take 81 mg by mouth daily.    Marland Kitchen atorvastatin (LIPITOR) 80 MG tablet Take 1 tablet (80 mg total) by mouth daily. 90 tablet 3  . cetirizine (ZYRTEC) 10 MG tablet Take 10 mg by mouth daily.    . fish oil-omega-3 fatty acids 1000 MG capsule Take 500 mg by mouth daily.     . Multiple Vitamin (MULTIVITAMIN WITH MINERALS) TABS Take 1 tablet by mouth daily.    . nitroGLYCERIN (NITROSTAT) 0.4 MG SL tablet Place 1 tablet (0.4 mg total) under the  tongue every 5 (five) minutes x 3 doses as needed for chest pain. 25 tablet 3  . diclofenac (VOLTAREN) 75 MG EC tablet TAKE 1 TABLET BY MOUTH TWICE A DAY 60 tablet 1  . diclofenac (VOLTAREN) 75 MG EC tablet Take 1 tablet (75 mg total) by mouth 2 (two) times daily. (Patient not taking: Reported on 06/16/2015) 50 tablet 2   Facility-Administered Medications Prior to Visit  Medication Dose Route Frequency Provider Last Rate Last Dose  . triamcinolone acetonide (KENALOG) 10 MG/ML injection 10 mg  10 mg Other Once Lenn Sink, DPM         Allergies:   Shrimp   Social History   Social History  . Marital Status: Married    Spouse Name: N/A  . Number of Children: N/A  . Years of Education: N/A   Social History Main Topics  . Smoking status: Never Smoker   . Smokeless tobacco: Never Used  . Alcohol Use: Yes     Comment: occasionally  . Drug Use: No  . Sexual Activity: Not Asked   Other Topics Concern  . 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   ROS:   Please see the history of present illness.    ROS All other systems reviewed and are negative.   PHYSICAL EXAM:   VS:  BP 132/80 mmHg  Pulse 65  Ht 5\' 10"  (1.778 m)  Wt 211 lb (95.709 kg)  BMI 30.28 kg/m2   GEN: Well nourished, well developed, in no acute distress HEENT: normal Neck: no JVD, carotid bruits, or masses Cardiac: RRR; no murmurs, rubs, or gallops,no edema.  Intact distal pulses bilaterally.  Respiratory:  clear to auscultation bilaterally, normal work of breathing GI: soft, nontender, nondistended, + BS MS: no deformity or atrophy Skin: warm and dry, no rash Neuro:  Alert and Oriented x 3, Strength and sensation are intact Psych: euthymic mood, full affect  Wt Readings from Last 3 Encounters:  06/16/15  211 lb (95.709 kg)  05/02/14 206 lb 3.2 oz (93.532 kg)  10/31/13 201 lb (91.173 kg)      Studies/Labs Reviewed:   EKG:  EKG was ordered today showing NSR at 65 bpm with no ST changes    Recent Labs: 06/08/2015: ALT 28   Lipid Panel    Component Value Date/Time   CHOL 127 06/08/2015 0748   CHOL 106 02/01/2013 0800   TRIG 44 06/08/2015 0748   TRIG 45 02/01/2013 0800   HDL 38* 06/08/2015 0748   HDL 40 02/01/2013 0800   CHOLHDL 3.3 06/08/2015 0748   VLDL 9 06/08/2015 0748   LDLCALC 80 06/08/2015 0748   LDLCALC 57 02/01/2013 0800   LDLDIRECT 58.4 04/17/2013 0739    Additional studies/ records that were reviewed today include:  Recent LFTs and lipids    ASSESSMENT:    1. Coronary artery disease due to lipid rich plaque   2. Syncope, unspecified syncope type   3. Hyperlipidemia      PLAN:  In order of problems listed above:  1.  CAD - patient has no symptoms of angina.  Continue ASA/statin 2.  Syncope - vasovagal - he has not had any reoccurence.  3.  Dyslipidemia - LDL on last check was 80 and goal is < 70.  His lipitor was just increased to 80mg  daily.  Will repeat FLP and ALT in 6 weeks.    He will followup with me in 6 months.    Medication Adjustments/Labs and Tests Ordered: Current medicines are reviewed at length with the patient today.  Concerns regarding medicines are outlined above.  Medication changes, Labs and Tests ordered today are listed in the Patient Instructions below. There are no Patient Instructions on file for this visit.   Harlon FlorSigned, TURNER,TRACI R, MD  06/16/2015 9:01 AM    [facility] [address], [place], [place]  [address] Phone: [phone]([phone]; Fax: [phone]

## 2015-06-16 ENCOUNTER — Ambulatory Visit (INDEPENDENT_AMBULATORY_CARE_PROVIDER_SITE_OTHER): Payer: BLUE CROSS/BLUE SHIELD | Admitting: Cardiology

## 2015-06-16 ENCOUNTER — Encounter: Payer: Self-pay | Admitting: Cardiology

## 2015-06-16 VITALS — BP 132/80 | HR 65 | Ht 70.0 in | Wt 211.0 lb

## 2015-06-16 DIAGNOSIS — R55 Syncope and collapse: Secondary | ICD-10-CM

## 2015-06-16 DIAGNOSIS — I2583 Coronary atherosclerosis due to lipid rich plaque: Principal | ICD-10-CM

## 2015-06-16 DIAGNOSIS — E785 Hyperlipidemia, unspecified: Secondary | ICD-10-CM

## 2015-06-16 DIAGNOSIS — I2510 Atherosclerotic heart disease of native coronary artery without angina pectoris: Secondary | ICD-10-CM | POA: Diagnosis not present

## 2015-06-16 NOTE — Patient Instructions (Signed)
Medication Instructions:  Your physician recommends that you continue on your current medications as directed. Please refer to the Current Medication list given to you today.   Labwork: Your physician recommends that you return for FASTING lab work in 6 weeks.  Testing/Procedures: None  Follow-Up: Your physician wants you to follow-up in: 6 months with Dr. Mayford Knifeurner. You will receive a reminder letter in the mail two months in advance. If you don't receive a letter, please call our office to schedule the follow-up appointment.   Any Other Special Instructions Will Be Listed Below (If Applicable).     If you need a refill on your cardiac medications before your next appointment, please call your pharmacy.

## 2015-06-16 NOTE — Addendum Note (Signed)
Addended by: Armanda MagicURNER, TRACI R on: 06/16/2015 10:30 AM   Modules accepted: SmartSet

## 2015-06-21 ENCOUNTER — Other Ambulatory Visit: Payer: Self-pay | Admitting: Interventional Cardiology

## 2015-06-21 ENCOUNTER — Other Ambulatory Visit: Payer: Self-pay | Admitting: Podiatry

## 2015-06-25 ENCOUNTER — Telehealth: Payer: Self-pay | Admitting: *Deleted

## 2015-06-25 NOTE — Telephone Encounter (Signed)
Pt request refill of Diclofenac.  I do not see in Medications orders that Dr. Charlsie Merlesegal ordered Diclofenac, and LOV was 08/2014.  Left message informing pt that he would need to make an appt to be seen.

## 2015-06-29 ENCOUNTER — Telehealth: Payer: Self-pay | Admitting: Podiatry

## 2015-06-29 NOTE — Telephone Encounter (Signed)
Left voicemail for pt to call office to schedule appt. °

## 2015-07-02 ENCOUNTER — Ambulatory Visit (INDEPENDENT_AMBULATORY_CARE_PROVIDER_SITE_OTHER): Payer: BLUE CROSS/BLUE SHIELD | Admitting: Podiatry

## 2015-07-02 ENCOUNTER — Encounter: Payer: Self-pay | Admitting: Podiatry

## 2015-07-02 DIAGNOSIS — M779 Enthesopathy, unspecified: Secondary | ICD-10-CM | POA: Diagnosis not present

## 2015-07-02 DIAGNOSIS — M2021 Hallux rigidus, right foot: Secondary | ICD-10-CM

## 2015-07-02 MED ORDER — TRIAMCINOLONE ACETONIDE 10 MG/ML IJ SUSP
10.0000 mg | Freq: Once | INTRAMUSCULAR | Status: AC
  Administered 2015-07-02: 10 mg

## 2015-07-02 MED ORDER — DICLOFENAC SODIUM 75 MG PO TBEC: 75.0000 mg | DELAYED_RELEASE_TABLET | Freq: Two times a day (BID) | ORAL | Status: DC

## 2015-07-02 NOTE — Progress Notes (Signed)
Subjective:     Patient ID: Stephen Curtis, male   DOB: 06/21/1950, 65 y.o.   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

## 2015-08-13 ENCOUNTER — Other Ambulatory Visit (INDEPENDENT_AMBULATORY_CARE_PROVIDER_SITE_OTHER): Payer: BLUE CROSS/BLUE SHIELD | Admitting: *Deleted

## 2015-08-13 DIAGNOSIS — E785 Hyperlipidemia, unspecified: Secondary | ICD-10-CM | POA: Diagnosis not present

## 2015-08-13 LAB — LIPID PANEL
CHOL/HDL RATIO: 2.6 ratio (ref <=5.0)
Cholesterol: 115 mg/dL — ABNORMAL LOW (ref 125–200)
HDL: 44 mg/dL (ref >=40)
LDL CALC: 62 mg/dL (ref <130)
TRIGLYCERIDES: 45 mg/dL (ref <150)
VLDL: 9 mg/dL (ref <30)

## 2015-08-13 LAB — HEPATIC FUNCTION PANEL
ALT: 34 U/L (ref 9–46)
AST: 22 U/L (ref 10–35)
Albumin: 4.6 g/dL (ref 3.6–5.1)
Alkaline Phosphatase: 56 U/L (ref 40–115)
BILIRUBIN DIRECT: 0.1 mg/dL (ref <=0.2)
BILIRUBIN INDIRECT: 0.4 mg/dL (ref 0.2–1.2)
Total Bilirubin: 0.5 mg/dL (ref 0.2–1.2)
Total Protein: 6.7 g/dL (ref 6.1–8.1)

## 2015-12-15 ENCOUNTER — Telehealth: Payer: Self-pay | Admitting: Cardiology

## 2015-12-15 ENCOUNTER — Encounter: Payer: Self-pay | Admitting: Cardiology

## 2015-12-15 NOTE — Telephone Encounter (Signed)
New Message  Pt call requesting to speak with RN. Pt wants to know if he will need to have lab work completed for his ov. Please call back to discuss

## 2015-12-15 NOTE — Telephone Encounter (Signed)
Will forward to Dr. Mayford Knifeurner and her nurse to see if patient needs lab work before his up coming appointment or at his appointment on 01/05/16.

## 2015-12-16 NOTE — Telephone Encounter (Signed)
No labs needed

## 2015-12-16 NOTE — Telephone Encounter (Signed)
Left message for patient that per Dr. Mayford Knifeurner, no lab work for next appointment is necessary. Instructed him to call if he had any other questions or concerns.

## 2015-12-30 ENCOUNTER — Ambulatory Visit: Payer: BLUE CROSS/BLUE SHIELD | Admitting: Cardiology

## 2016-01-05 ENCOUNTER — Ambulatory Visit: Payer: BLUE CROSS/BLUE SHIELD | Admitting: Cardiology

## 2016-02-07 ENCOUNTER — Other Ambulatory Visit: Payer: Self-pay | Admitting: Podiatry

## 2016-02-25 ENCOUNTER — Ambulatory Visit (INDEPENDENT_AMBULATORY_CARE_PROVIDER_SITE_OTHER): Payer: BLUE CROSS/BLUE SHIELD | Admitting: Cardiology

## 2016-02-25 ENCOUNTER — Encounter: Payer: Self-pay | Admitting: Cardiology

## 2016-02-25 VITALS — BP 140/78 | HR 66 | Ht 70.0 in | Wt 213.8 lb

## 2016-02-25 DIAGNOSIS — E7800 Pure hypercholesterolemia, unspecified: Secondary | ICD-10-CM

## 2016-02-25 DIAGNOSIS — R55 Syncope and collapse: Secondary | ICD-10-CM | POA: Diagnosis not present

## 2016-02-25 DIAGNOSIS — I2510 Atherosclerotic heart disease of native coronary artery without angina pectoris: Secondary | ICD-10-CM | POA: Diagnosis not present

## 2016-02-25 NOTE — Progress Notes (Signed)
Cardiology Office Note    Date:  02/25/2016   ID:  Stephen Curtis, DOB 01/18/1951, MRN [id number]  PCP:  Mickie HillierLITTLE,KEVIN LORNE, MD  Cardiologist:  Armanda Magicraci [place] Fant, MD   Chief Complaint  Patient presents with  . Coronary Artery Disease  . Hyperlipidemia    History of Present Illness:  Stephen Curtis is a 65 y.o. male with a history of CAD, dyslipidemia and vasovagal syncope presents today for followup. He has been doing well. He denies any chest pain, DOE, SOB, palpitations, dizziness, LE edema or syncope. He has had no more problems with vasovagal episodes. He walks for exercise and goes to the gym but not as much as he had been doing.    Past Medical History:  Diagnosis Date  . BPH (benign prostatic hypertrophy)   . CAD (coronary artery disease)    subtotalled RCA wth borderline 50-70% mid LAD s/p DES to RCA  . Hyperlipidemia LDL goal <70   . Retinopathy   . Vasovagal syncope   . Vertigo    hearing loss left ear evaluated by ENT Dr. Jearld FentonByers    Past Surgical History:  Procedure Laterality Date  . CARDIAC CATHETERIZATION    . FLEXIBLE SIGMOIDOSCOPY    . knee arthroscopic Left   . LEFT HEART CATHETERIZATION WITH CORONARY ANGIOGRAM N/A 05/17/2012   Procedure: LEFT HEART CATHETERIZATION WITH CORONARY ANGIOGRAM;  Surgeon: Lesleigh NoeHenry W Smith III, MD;  [facility];  Service: Cardiovascular;  Laterality: N/A;    Current Medications: Outpatient Medications Prior to Visit  Medication Sig Dispense Refill  . aspirin EC 81 MG tablet Take 81 mg by mouth daily.    Marland Kitchen. atorvastatin (LIPITOR) 80 MG tablet Take 1 tablet (80 mg total) by mouth daily. 90 tablet 3  . cetirizine (ZYRTEC) 10 MG tablet Take 10 mg by mouth daily.    . diclofenac (VOLTAREN) 75 MG EC tablet Take 75 mg by mouth 2 (two) times daily as needed (inflammation).    Marland Kitchen. diclofenac (VOLTAREN) 75 MG EC tablet Take 1 tablet (75 mg total) by mouth 2 (two) times daily. 50 tablet 2  . Multiple Vitamin (MULTIVITAMIN WITH  MINERALS) TABS Take 1 tablet by mouth daily.    Marland Kitchen. NITROSTAT 0.4 MG SL tablet PLACE 1 TABLET UNDER THE TONGUE EVERY 5 MINUTES TIMES 3 DOSES AS NEEDED FOR CHEST PAIN 25 tablet 5  . OMEPRAZOLE PO Take 1 tablet by mouth daily as needed (reflux).    . fish oil-omega-3 fatty acids 1000 MG capsule Take 500 mg by mouth daily.      Facility-Administered Medications Prior to Visit  Medication Dose Route Frequency Provider Last Rate Last Dose  . triamcinolone acetonide (KENALOG) 10 MG/ML injection 10 mg  10 mg Other Once Lenn SinkNorman S Regal, DPM         Allergies:   Shrimp [shellfish allergy]   Social History   Social History  . Marital status: Married    Spouse name: N/A  . Number of children: N/A  . Years of education: N/A   Social History Main Topics  . Smoking status: Never Smoker  . Smokeless tobacco: Never Used  . Alcohol use Yes     Comment: occasionally  . Drug use: No  . Sexual activity: Not on file   Other Topics Concern  . 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   ROS:   Please see the history of present illness.    ROS All other systems reviewed and are negative.  No flowsheet data found.     PHYSICAL EXAM:   VS:  BP 140/78   Pulse 66   Ht 5\' 10"  (1.778 m)   Wt 213 lb 12.8 oz (97 kg)   BMI 30.68 kg/m    GEN: Well nourished, well developed, in no acute distress  HEENT: normal  Neck: no JVD, carotid bruits, or masses Cardiac: RRR; no murmurs, rubs, or gallops,no edema.  Intact distal pulses bilaterally.  Respiratory:  clear to auscultation bilaterally, normal work of breathing GI: soft, nontender, nondistended, + BS MS: no deformity or atrophy  Skin: warm and dry, no rash Neuro:  Alert and Oriented x 3, Strength and sensation are intact Psych:  euthymic mood, full affect  Wt Readings from Last 3 Encounters:  02/25/16 213 lb 12.8 oz (97 kg)  06/16/15 211 lb (95.7 kg)  05/02/14 206 lb 3.2 oz (93.5 kg)      Studies/Labs Reviewed:   EKG:  EKG is not ordered today.    Recent Labs: 08/13/2015: ALT 34   Lipid Panel    Component Value Date/Time   CHOL 115 (L) 08/13/2015 0750   CHOL 106 02/01/2013 0800   TRIG 45 08/13/2015 0750   TRIG 45 02/01/2013 0800   HDL 44 08/13/2015 0750   HDL 40 02/01/2013 0800   CHOLHDL 2.6 08/13/2015 0750   VLDL 9 08/13/2015 0750   LDLCALC 62 08/13/2015 0750   LDLCALC 57 02/01/2013 0800   LDLDIRECT 58.4 04/17/2013 0739    Additional studies/ records that were reviewed today include:  none    ASSESSMENT:    1. Coronary artery disease involving native coronary artery of native heart without angina pectoris   2. Syncope, unspecified syncope type   3. Pure hypercholesterolemia      PLAN:  In order of problems listed above:  1. ASCAD with subtotalled RCA wth borderline 50-70% mid LAD s/p DES to RCA.  He has not had any anginal symptoms.  Continue ASA and statin.  2. SYncope with no reoccurence 3. Hyperlipidemia with LDL goal < 70. Continue statin.     Medication Adjustments/Labs and Tests Ordered: Current medicines are reviewed at length with the patient today.  Concerns regarding medicines are outlined above.  Medication changes, Labs and Tests ordered today are listed in the Patient Instructions below.  There are no Patient Instructions on file for this visit.   Signed, Armanda Magicraci Amiri Tritch, MD  02/25/2016 8:23 AM    [facility] [address], [place], [place]  [address] Phone: [phone]; Fax: [phone]

## 2016-02-25 NOTE — Patient Instructions (Signed)

## 2016-03-04 ENCOUNTER — Other Ambulatory Visit: Payer: BLUE CROSS/BLUE SHIELD | Admitting: *Deleted

## 2016-03-04 DIAGNOSIS — E7800 Pure hypercholesterolemia, unspecified: Secondary | ICD-10-CM

## 2016-03-04 LAB — HEPATIC FUNCTION PANEL
ALK PHOS: 48 U/L (ref 40–115)
ALT: 30 U/L (ref 9–46)
AST: 21 U/L (ref 10–35)
Albumin: 4.3 g/dL (ref 3.6–5.1)
BILIRUBIN DIRECT: 0.1 mg/dL (ref <=0.2)
BILIRUBIN INDIRECT: 0.4 mg/dL (ref 0.2–1.2)
Total Bilirubin: 0.5 mg/dL (ref 0.2–1.2)
Total Protein: 6.7 g/dL (ref 6.1–8.1)

## 2016-03-04 LAB — LIPID PANEL
CHOL/HDL RATIO: 3.3 ratio (ref <5.0)
Cholesterol: 122 mg/dL (ref <200)
HDL: 37 mg/dL — AB (ref >40)
LDL CALC: 72 mg/dL (ref <100)
TRIGLYCERIDES: 64 mg/dL (ref <150)
VLDL: 13 mg/dL (ref <30)

## 2016-05-24 ENCOUNTER — Telehealth: Payer: Self-pay | Admitting: Cardiology

## 2016-05-24 NOTE — Telephone Encounter (Signed)
Patient is due to be seen in June.  To Dr. Mayford Knifeurner for lab orders, if necessary.

## 2016-05-24 NOTE — Telephone Encounter (Signed)
New Message  Pt is wanting to know if he needs labs prior to his appt.

## 2016-05-24 NOTE — Telephone Encounter (Signed)
No labs needed since he just had lipids done 3 months ago

## 2016-05-25 ENCOUNTER — Other Ambulatory Visit: Payer: Self-pay | Admitting: Cardiology

## 2016-05-25 NOTE — Telephone Encounter (Signed)
Scheduled patient for 6 mo OV 6/20 at 1540. He understands he will not need labs prior to this appointment. He was grateful for call.

## 2016-07-05 ENCOUNTER — Other Ambulatory Visit: Payer: Self-pay | Admitting: Cardiology

## 2016-08-31 ENCOUNTER — Ambulatory Visit (INDEPENDENT_AMBULATORY_CARE_PROVIDER_SITE_OTHER): Payer: BLUE CROSS/BLUE SHIELD | Admitting: Cardiology

## 2016-08-31 ENCOUNTER — Encounter: Payer: Self-pay | Admitting: Cardiology

## 2016-08-31 VITALS — BP 118/68 | HR 63 | Ht 70.0 in | Wt 211.8 lb

## 2016-08-31 DIAGNOSIS — E7800 Pure hypercholesterolemia, unspecified: Secondary | ICD-10-CM | POA: Diagnosis not present

## 2016-08-31 DIAGNOSIS — R55 Syncope and collapse: Secondary | ICD-10-CM

## 2016-08-31 DIAGNOSIS — I2510 Atherosclerotic heart disease of native coronary artery without angina pectoris: Secondary | ICD-10-CM | POA: Diagnosis not present

## 2016-08-31 NOTE — Patient Instructions (Signed)

## 2016-08-31 NOTE — Progress Notes (Signed)
Cardiology Office Note    Date:  08/31/2016   ID:  ALPHA MYSLIWIEC, DOB 1951-02-28, MRN [id number]  PCP:  Catha Gosselin, MD  Cardiologist:  Armanda Magic, MD   Chief Complaint  Patient presents with  . Coronary Artery Disease  . Hyperlipidemia    History of Present Illness:  Stephen Curtis is a 66 y.o. male with a history of CAD cath with subtotalled RCA with borderline 50-70% mid LAD s/p PCI of RCA 2014), dyslipidemia and vasovagal syncope.  He presents today for followup and is doing well. He denies any chest pain or pressure, DOE, SOB, PND, orthopnea, palpitations, LE edema or syncope. He has not had any  asovagal episodes. Occasionally he will become dizzy if he gets up to fast from a supine position.  He walks for exercise.  Past Medical History:  Diagnosis Date  . BPH (benign prostatic hypertrophy)   . CAD (coronary artery disease)    subtotalled RCA wth borderline 50-70% mid LAD s/p DES to RCA  . Hyperlipidemia LDL goal <70   . Retinopathy   . Vasovagal syncope   . Vertigo    hearing loss left ear evaluated by ENT Dr. Jearld Fenton    Past Surgical History:  Procedure Laterality Date  . CARDIAC CATHETERIZATION    . FLEXIBLE SIGMOIDOSCOPY    . knee arthroscopic Left   . LEFT HEART CATHETERIZATION WITH CORONARY ANGIOGRAM N/A 05/17/2012   Procedure: LEFT HEART CATHETERIZATION WITH CORONARY ANGIOGRAM;  Surgeon: Lesleigh Noe, MD;  Location: [facility] CATH LAB;  Service: Cardiovascular;  Laterality: N/A;    Current Medications: Current Meds  Medication Sig  . aspirin EC 81 MG tablet Take 81 mg by mouth daily.  Marland Kitchen atorvastatin (LIPITOR) 80 MG tablet TAKE 1 TABLET (80 MG TOTAL) BY MOUTH DAILY.  . cetirizine (ZYRTEC) 10 MG tablet Take 10 mg by mouth daily.  . diclofenac (VOLTAREN) 75 MG EC tablet Take 75 mg by mouth 2 (two) times daily as needed (inflammation).  . finasteride (PROSCAR) 5 MG tablet Take 5 mg by mouth daily.  Marland Kitchen MEGARED OMEGA-3 KRILL OIL 500 MG CAPS Take by mouth.   . Multiple Vitamin (MULTIVITAMIN WITH MINERALS) TABS Take 1 tablet by mouth daily.  . nitroGLYCERIN (NITROSTAT) 0.4 MG SL tablet PLACE 1 TABLET UNDER THE TOUNGE EVERY 5 MINUTES AS NNEDED FOR CHEST PAIN. MAX 3 DOSES  . OMEPRAZOLE PO Take 1 tablet by mouth daily as needed (reflux).   Current Facility-Administered Medications for the 08/31/16 encounter (Office Visit) with Quintella Reichert, MD  Medication  . triamcinolone acetonide (KENALOG) 10 MG/ML injection 10 mg    Allergies:   Shrimp [shellfish allergy]   Social History   Social History  . Marital status: Married    Spouse name: N/A  . Number of children: N/A  . Years of education: N/A   Social History Main Topics  . Smoking status: Never Smoker  . Smokeless tobacco: Never Used  . Alcohol use Yes     Comment: occasionally  . Drug use: No  . Sexual activity: Not Asked   Other Topics Concern  . 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   ROS:   Please see the history of present illness.    ROS All  other systems reviewed and are negative.  No flowsheet data found.     PHYSICAL EXAM:   VS:  BP 118/68   Pulse 63   Ht 5\' 10"  (1.778 m)   Wt 211 lb 12.8 oz (96.1 kg)   BMI 30.39 kg/m    GEN: Well nourished, well developed, in no acute distress  HEENT: normal  Neck: no JVD, carotid bruits, or masses Cardiac: RRR; no murmurs, rubs, or gallops,no edema.  Intact distal pulses bilaterally.  Respiratory:  clear to auscultation bilaterally, normal work of breathing GI: soft, nontender, nondistended, + BS MS: no deformity or atrophy  Skin: warm and dry, no rash Neuro:  Alert and Oriented x 3, Strength and sensation are intact Psych: euthymic mood, full affect  Wt Readings from Last 3 Encounters:  08/31/16 211 lb 12.8  oz (96.1 kg)  02/25/16 213 lb 12.8 oz (97 kg)  06/16/15 211 lb (95.7 kg)      Studies/Labs Reviewed:   EKG:  EKG is ordered today.  The ekg ordered today demonstrates NSR at 63bpm with no ST changes  Recent Labs: 03/04/2016: ALT 30   Lipid Panel    Component Value Date/Time   CHOL 122 03/04/2016 0811   CHOL 106 02/01/2013 0800   TRIG 64 03/04/2016 0811   TRIG 45 02/01/2013 0800   HDL 37 (L) 03/04/2016 0811   HDL 40 02/01/2013 0800   CHOLHDL 3.3 03/04/2016 0811   VLDL 13 03/04/2016 0811   LDLCALC 72 03/04/2016 0811   LDLCALC 57 02/01/2013 0800   LDLDIRECT 58.4 04/17/2013 0739    Additional studies/ records that were reviewed today include:  none    ASSESSMENT:    1. Coronary artery disease involving native coronary artery of native heart without angina pectoris   2. Vasovagal syncope   3. Pure hypercholesterolemia      PLAN:  In order of problems listed above:  1. ASCAD with subtotalled RCA wth borderline 50-70% mid LAD s/p DES to RCA at cath in 2014.  He denies any anginal symptoms.  He will continue ASA 81mg  daily and atorvastatin 80mg  daily. 2. Vasovagal syncope with no reoccurence 3. Hyperlipidemia with LDL goal < 70. His last LDL was 72 in Dec 2017.  I will get a copy of the FLP he is getting done at his PCP office tomorrow.  He will continue on high dose statin.     Medication Adjustments/Labs and Tests Ordered: Current medicines are reviewed at length with the patient today.  Concerns regarding medicines are outlined above.  Medication changes, Labs and Tests ordered today are listed in the Patient Instructions below.  There are no Patient Instructions on file for this visit.   Signed, Armanda Magicraci Turner, MD  08/31/2016 3:56 PM    [facility] [address], [place], [place]  [address] Phone: [phone]; Fax: [phone]) [phone]

## 2017-02-10 ENCOUNTER — Other Ambulatory Visit: Payer: Self-pay | Admitting: Cardiology

## 2017-07-14 ENCOUNTER — Other Ambulatory Visit: Payer: Self-pay | Admitting: Cardiology

## 2017-07-14 MED ORDER — ATORVASTATIN CALCIUM 80 MG PO TABS: 80.0000 mg | ORAL_TABLET | Freq: Every day | ORAL | 0 refills | Status: DC

## 2017-07-22 DIAGNOSIS — B349 Viral infection, unspecified: Secondary | ICD-10-CM | POA: Diagnosis not present

## 2017-07-29 ENCOUNTER — Other Ambulatory Visit: Payer: Self-pay | Admitting: Cardiology

## 2017-08-15 DIAGNOSIS — N401 Enlarged prostate with lower urinary tract symptoms: Secondary | ICD-10-CM | POA: Diagnosis not present

## 2017-08-15 DIAGNOSIS — R972 Elevated prostate specific antigen [PSA]: Secondary | ICD-10-CM | POA: Diagnosis not present

## 2017-08-15 DIAGNOSIS — N5201 Erectile dysfunction due to arterial insufficiency: Secondary | ICD-10-CM | POA: Diagnosis not present

## 2017-08-15 DIAGNOSIS — R3911 Hesitancy of micturition: Secondary | ICD-10-CM | POA: Diagnosis not present

## 2017-08-24 ENCOUNTER — Telehealth: Payer: Self-pay | Admitting: Cardiology

## 2017-08-24 ENCOUNTER — Other Ambulatory Visit: Payer: Medicare Other | Admitting: *Deleted

## 2017-08-24 DIAGNOSIS — E785 Hyperlipidemia, unspecified: Secondary | ICD-10-CM

## 2017-08-24 LAB — LIPID PANEL
Chol/HDL Ratio: 3 ratio (ref 0.0–5.0)
Cholesterol, Total: 114 mg/dL (ref 100–199)
HDL: 38 mg/dL — ABNORMAL LOW (ref >39)
LDL Calculated: 67 mg/dL (ref 0–99)
Triglycerides: 45 mg/dL (ref 0–149)
VLDL Cholesterol Cal: 9 mg/dL (ref 5–40)

## 2017-08-24 LAB — HEPATIC FUNCTION PANEL
ALT: 37 IU/L (ref 0–44)
AST: 27 IU/L (ref 0–40)
Albumin: 4.7 g/dL (ref 3.6–4.8)
Alkaline Phosphatase: 55 IU/L (ref 39–117)
Bilirubin Total: 0.4 mg/dL (ref 0.0–1.2)
Bilirubin, Direct: 0.12 mg/dL (ref 0.00–0.40)
Total Protein: 6.7 g/dL (ref 6.0–8.5)

## 2017-08-24 NOTE — Telephone Encounter (Signed)
Pt requesting to get FLP and liver drawn prior to annual OV with Dr. Mayford Knifeurner on 08/31/17. Orders placed. Pt states he will come in today and confirmed he has been fasting. He stated understanding and thankful for the call

## 2017-08-24 NOTE — Telephone Encounter (Signed)
New message    Patient requesting order for annual labs Patient would like to get labs done today. Please call

## 2017-08-31 ENCOUNTER — Ambulatory Visit (INDEPENDENT_AMBULATORY_CARE_PROVIDER_SITE_OTHER): Payer: Medicare Other | Admitting: Cardiology

## 2017-08-31 ENCOUNTER — Encounter: Payer: Self-pay | Admitting: Cardiology

## 2017-08-31 VITALS — BP 138/76 | HR 67 | Ht 70.0 in | Wt 211.0 lb

## 2017-08-31 DIAGNOSIS — E7800 Pure hypercholesterolemia, unspecified: Secondary | ICD-10-CM | POA: Diagnosis not present

## 2017-08-31 DIAGNOSIS — R55 Syncope and collapse: Secondary | ICD-10-CM | POA: Diagnosis not present

## 2017-08-31 DIAGNOSIS — I2510 Atherosclerotic heart disease of native coronary artery without angina pectoris: Secondary | ICD-10-CM | POA: Diagnosis not present

## 2017-08-31 NOTE — Patient Instructions (Signed)
Medication Instructions:  Your physician recommends that you continue on your current medications as directed. Please refer to the Current Medication list given to you today.   Labwork: 1 YEAR:  LIPID & LFT  Testing/Procedures: None ordered  Follow-Up: Your physician wants you to follow-up in: 1 YEAR WITH DR. Sherlyn LickURNER   You will receive a reminder letter in the mail two months in advance. If you don't receive a letter, please call our office to schedule the follow-up appointment.   Any Other Special Instructions Will Be Listed Below (If Applicable).     If you need a refill on your cardiac medications before your next appointment, please call your pharmacy.

## 2017-08-31 NOTE — Progress Notes (Signed)
Cardiology Office Note:    Date:  08/31/2017   ID:  Stephen Curtis, DOB 26-May-1950, MRN [id number]  PCP:  Catha Gosselin, MD  Cardiologist:  No primary care provider on file.    Referring MD: Catha Gosselin, MD   Chief Complaint  Patient presents with  . Coronary Artery Disease  . Hyperlipidemia    History of Present Illness:    Stephen Curtis is a 67 y.o. male with a hx of CAD cath with subtotalled RCA with borderline 50-70% mid LAD s/p PCI of RCA 2014, dyslipidemia and vasovagal syncope.  he is here today for followup and is doing well.  He denies any chest pain or pressure, SOB, DOE, PND, orthopnea, LE edema, dizziness, palpitations or syncope. He is compliant with his meds and is tolerating meds with no SE.    Past Medical History:  Diagnosis Date  . BPH (benign prostatic hypertrophy)   . CAD (coronary artery disease)    subtotalled RCA wth borderline 50-70% mid LAD s/p DES to RCA  . Hyperlipidemia LDL goal <70   . Retinopathy   . Vasovagal syncope   . Vertigo    hearing loss left ear evaluated by ENT Dr. Jearld Fenton    Past Surgical History:  Procedure Laterality Date  . CARDIAC CATHETERIZATION    . FLEXIBLE SIGMOIDOSCOPY    . knee arthroscopic Left   . LEFT HEART CATHETERIZATION WITH CORONARY ANGIOGRAM N/A 05/17/2012   Procedure: LEFT HEART CATHETERIZATION WITH CORONARY ANGIOGRAM;  Surgeon: Lesleigh Noe, MD;  Location: [facility] CATH LAB;  Service: Cardiovascular;  Laterality: N/A;    Current Medications: Current Meds  Medication Sig  . aspirin EC 81 MG tablet Take 81 mg by mouth daily.  Marland Kitchen atorvastatin (LIPITOR) 80 MG tablet Take 1 tablet (80 mg total) by mouth daily. Please keep upcoming appt for future refills. Thank you  . cetirizine (ZYRTEC) 10 MG tablet Take 10 mg by mouth daily.  . diclofenac (VOLTAREN) 75 MG EC tablet Take 75 mg by mouth 2 (two) times daily as needed (inflammation).  . finasteride (PROSCAR) 5 MG tablet Take 5 mg by mouth daily.  Marland Kitchen MEGARED  OMEGA-3 KRILL OIL 500 MG CAPS Take by mouth.  . Multiple Vitamin (MULTIVITAMIN WITH MINERALS) TABS Take 1 tablet by mouth daily.  . nitroGLYCERIN (NITROSTAT) 0.4 MG SL tablet PLACE 1 TABLET UNDER THE TOUNGE EVERY 5 MINUTES AS NNEDED FOR CHEST PAIN. MAX 3 DOSES  . OMEPRAZOLE PO Take 1 tablet by mouth daily as needed (reflux).  Marland Kitchen SILDENAFIL CITRATE PO Take by mouth as needed.  . tamsulosin (FLOMAX) 0.4 MG CAPS capsule Take 0.4 mg by mouth daily.   Current Facility-Administered Medications for the 08/31/17 encounter (Office Visit) with Quintella Reichert, MD  Medication  . triamcinolone acetonide (KENALOG) 10 MG/ML injection 10 mg     Allergies:   Other and Shrimp [shellfish allergy]   Social History   Socioeconomic History  . Marital status: Married    Spouse name: Not on file  . Number of children: Not on file  . Years of education: Not on file  . Highest education level: Not on file  Occupational History  . Not on file  Social Needs  . Financial resource strain: Not on file  . Food insecurity:    Worry: Not on file    Inability: Not on file  . Transportation needs:    Medical: Not on file    Non-medical: Not on file  Tobacco Use  .  Smoking status: Never Smoker  . Smokeless tobacco: Never Used  Substance and Sexual Activity  . Alcohol use: Yes    Comment: occasionally  . Drug use: No  . Sexual activity: Not on file  Lifestyle  . Physical activity:    Days per week: Not on file    Minutes per session: Not on file  . Stress: Not on file  Relationships  . Social connections:    Talks on phone: Not on file    Gets together: Not on file    Attends religious service: Not on file    Active member of club or organization: Not on file    Attends meetings of clubs or organizations: Not on file    Relationship status: Not on file  Other Topics Concern  . 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  ROS:   Please see the history of present illness.    ROS  All other systems reviewed and negative.   EKGs/Labs/Other Studies Reviewed:    The following studies were reviewed today: none  EKG:  EKG is not ordered today.   Recent Labs: 08/24/2017: ALT 37   Recent Lipid Panel    Component Value Date/Time   CHOL 114 08/24/2017 0000   CHOL 106 02/01/2013 0800   TRIG 45 08/24/2017 0000   TRIG 45 02/01/2013 0800   HDL 38 (L) 08/24/2017 0000   HDL 40 02/01/2013 0800   CHOLHDL 3.0 08/24/2017 0000   CHOLHDL 3.3 03/04/2016 0811   VLDL 13 03/04/2016 0811   LDLCALC 67 08/24/2017 0000   LDLCALC 57 02/01/2013 0800   LDLDIRECT 58.4 04/17/2013 0739    Physical Exam:    VS:  BP 138/76   Pulse 67   Ht 5\' 10"  (1.778 m)   Wt 211 lb (95.7 kg)   SpO2 96%   BMI 30.28 kg/m     Wt Readings from Last 3 Encounters:  08/31/17 211 lb (95.7 kg)  08/31/16 211 lb 12.8 oz (96.1 kg)  02/25/16 213 lb 12.8 oz (97 kg)     GEN:  Well nourished, well developed in no acute distress HEENT: Normal NECK: No JVD; No carotid bruits LYMPHATICS: No lymphadenopathy CARDIAC: RRR, no murmurs, rubs, gallops RESPIRATORY:  Clear to auscultation without rales, wheezing or rhonchi  ABDOMEN: Soft, non-tender, non-distended MUSCULOSKELETAL:  No edema; No deformity  SKIN: Warm and dry NEUROLOGIC:  Alert and oriented x 3 PSYCHIATRIC:  Normal affect   ASSESSMENT:    1. Coronary artery disease involving native coronary artery of native heart without angina pectoris   2. Pure hypercholesterolemia   3. Vasovagal syncope    PLAN:    In order of problems listed above:  1.  ASCAD - cath with subtotalled RCA with borderline 50-70% mid LAD s/p PCI of RCA 2014.  He denies any anginal sx.  He will continue on ASA  81mg  daily and statin.  2.  Hyperlipidemia - LDL goal is < 70.  He will continue on Atorvastatin 80mg  daily.  His LDL was at goal at 67 on 08/24/2017.  ALT was normal at 37.  3.  Vasovagal syncope - he has not had any further syncopal episodes.  Medication Adjustments/Labs and Tests Ordered: Current medicines are reviewed at length with the patient today.  Concerns regarding medicines are outlined above.  Orders Placed This Encounter  Procedures  . EKG 12-Lead   No orders of the defined types were placed in this encounter.   Signed, Armanda Magicraci Turner, MD  08/31/2017 10:06 AM    [facility]

## 2017-09-10 ENCOUNTER — Other Ambulatory Visit: Payer: Self-pay | Admitting: Cardiology

## 2017-09-21 DIAGNOSIS — Z125 Encounter for screening for malignant neoplasm of prostate: Secondary | ICD-10-CM | POA: Diagnosis not present

## 2017-09-21 DIAGNOSIS — Z8042 Family history of malignant neoplasm of prostate: Secondary | ICD-10-CM | POA: Diagnosis not present

## 2017-09-21 DIAGNOSIS — I2510 Atherosclerotic heart disease of native coronary artery without angina pectoris: Secondary | ICD-10-CM | POA: Diagnosis not present

## 2017-09-21 DIAGNOSIS — R7301 Impaired fasting glucose: Secondary | ICD-10-CM | POA: Diagnosis not present

## 2017-09-21 DIAGNOSIS — Z23 Encounter for immunization: Secondary | ICD-10-CM | POA: Diagnosis not present

## 2017-09-21 DIAGNOSIS — N400 Enlarged prostate without lower urinary tract symptoms: Secondary | ICD-10-CM | POA: Diagnosis not present

## 2017-09-21 DIAGNOSIS — E7800 Pure hypercholesterolemia, unspecified: Secondary | ICD-10-CM | POA: Diagnosis not present

## 2017-09-21 DIAGNOSIS — Z79899 Other long term (current) drug therapy: Secondary | ICD-10-CM | POA: Diagnosis not present

## 2017-09-21 DIAGNOSIS — Z0000 Encounter for general adult medical examination without abnormal findings: Secondary | ICD-10-CM | POA: Diagnosis not present

## 2017-09-21 DIAGNOSIS — Z800 Family history of malignant neoplasm of digestive organs: Secondary | ICD-10-CM | POA: Diagnosis not present

## 2017-12-13 DIAGNOSIS — Z23 Encounter for immunization: Secondary | ICD-10-CM | POA: Diagnosis not present

## 2018-02-13 DIAGNOSIS — R3911 Hesitancy of micturition: Secondary | ICD-10-CM | POA: Diagnosis not present

## 2018-02-13 DIAGNOSIS — N401 Enlarged prostate with lower urinary tract symptoms: Secondary | ICD-10-CM | POA: Diagnosis not present

## 2018-02-13 DIAGNOSIS — N5201 Erectile dysfunction due to arterial insufficiency: Secondary | ICD-10-CM | POA: Diagnosis not present

## 2018-05-15 DIAGNOSIS — H43393 Other vitreous opacities, bilateral: Secondary | ICD-10-CM | POA: Diagnosis not present

## 2018-05-15 DIAGNOSIS — H25013 Cortical age-related cataract, bilateral: Secondary | ICD-10-CM | POA: Diagnosis not present

## 2018-05-15 DIAGNOSIS — H2513 Age-related nuclear cataract, bilateral: Secondary | ICD-10-CM | POA: Diagnosis not present

## 2018-05-23 DIAGNOSIS — H25012 Cortical age-related cataract, left eye: Secondary | ICD-10-CM | POA: Diagnosis not present

## 2018-05-23 DIAGNOSIS — H2512 Age-related nuclear cataract, left eye: Secondary | ICD-10-CM | POA: Diagnosis not present

## 2018-05-23 DIAGNOSIS — H25011 Cortical age-related cataract, right eye: Secondary | ICD-10-CM | POA: Diagnosis not present

## 2018-05-23 DIAGNOSIS — H2511 Age-related nuclear cataract, right eye: Secondary | ICD-10-CM | POA: Diagnosis not present

## 2018-07-25 DIAGNOSIS — H25012 Cortical age-related cataract, left eye: Secondary | ICD-10-CM | POA: Diagnosis not present

## 2018-07-25 DIAGNOSIS — H2512 Age-related nuclear cataract, left eye: Secondary | ICD-10-CM | POA: Diagnosis not present

## 2018-09-25 DIAGNOSIS — E7800 Pure hypercholesterolemia, unspecified: Secondary | ICD-10-CM | POA: Diagnosis not present

## 2018-09-25 DIAGNOSIS — Z125 Encounter for screening for malignant neoplasm of prostate: Secondary | ICD-10-CM | POA: Diagnosis not present

## 2018-09-25 DIAGNOSIS — Z0000 Encounter for general adult medical examination without abnormal findings: Secondary | ICD-10-CM | POA: Diagnosis not present

## 2018-09-25 DIAGNOSIS — Z79899 Other long term (current) drug therapy: Secondary | ICD-10-CM | POA: Diagnosis not present

## 2018-09-25 DIAGNOSIS — R7301 Impaired fasting glucose: Secondary | ICD-10-CM | POA: Diagnosis not present

## 2018-09-26 DIAGNOSIS — Z800 Family history of malignant neoplasm of digestive organs: Secondary | ICD-10-CM | POA: Diagnosis not present

## 2018-09-26 DIAGNOSIS — I2510 Atherosclerotic heart disease of native coronary artery without angina pectoris: Secondary | ICD-10-CM | POA: Diagnosis not present

## 2018-09-26 DIAGNOSIS — R7301 Impaired fasting glucose: Secondary | ICD-10-CM | POA: Diagnosis not present

## 2018-09-26 DIAGNOSIS — Z8042 Family history of malignant neoplasm of prostate: Secondary | ICD-10-CM | POA: Diagnosis not present

## 2018-09-26 DIAGNOSIS — E782 Mixed hyperlipidemia: Secondary | ICD-10-CM | POA: Diagnosis not present

## 2018-09-26 DIAGNOSIS — Z79899 Other long term (current) drug therapy: Secondary | ICD-10-CM | POA: Diagnosis not present

## 2018-09-26 DIAGNOSIS — N400 Enlarged prostate without lower urinary tract symptoms: Secondary | ICD-10-CM | POA: Diagnosis not present

## 2018-10-02 ENCOUNTER — Other Ambulatory Visit: Payer: Self-pay | Admitting: Cardiology

## 2018-11-18 NOTE — Progress Notes (Signed)
Cardiology Office Note:    Date:  11/20/2018   ID:  Stephen Curtis, DOB 06/25/1950, MRN [id number]  PCP:  Catha Gosselin, MD  Cardiologist:  No primary care provider on file.    Referring MD: Catha Gosselin, MD   Chief Complaint  Patient presents with  . Coronary Artery Disease  . Hyperlipidemia    History of Present Illness:    Stephen Curtis is a 68 y.o. male with a hx of CADand cath with subtotalled RCA with borderline 50-70% mid LAD s/p PCI of RCA 2014, dyslipidemia and vasovagal syncope.  He is here today for followup and is doing well.  He denies any chest pain or pressure, SOB, DOE, PND, orthopnea, LE edema, dizziness, palpitations or syncope. He is compliant with her meds and is tolerating meds with no SE.    Past Medical History:  Diagnosis Date  . BPH (benign prostatic hypertrophy)   . CAD (coronary artery disease)    subtotalled RCA wth borderline 50-70% mid LAD s/p DES to RCA  . Hyperlipidemia LDL goal <70   . Retinopathy   . Vasovagal syncope   . Vertigo    hearing loss left ear evaluated by ENT Dr. Jearld Fenton    Past Surgical History:  Procedure Laterality Date  . CARDIAC CATHETERIZATION    . FLEXIBLE SIGMOIDOSCOPY    . knee arthroscopic Left   . LEFT HEART CATHETERIZATION WITH CORONARY ANGIOGRAM N/A 05/17/2012   Procedure: LEFT HEART CATHETERIZATION WITH CORONARY ANGIOGRAM;  Surgeon: Lesleigh Noe, MD;  Location: [facility] CATH LAB;  Service: Cardiovascular;  Laterality: N/A;    Current Medications: Current Meds  Medication Sig  . aspirin EC 81 MG tablet Take 81 mg by mouth daily.  Marland Kitchen atorvastatin (LIPITOR) 80 MG tablet Take 1 tablet (80 mg total) by mouth daily. Please keep upcoming appt for future refills. Thank you  . cetirizine (ZYRTEC) 10 MG tablet Take 10 mg by mouth daily.  . finasteride (PROSCAR) 5 MG tablet Take 5 mg by mouth daily.  Marland Kitchen MEGARED OMEGA-3 KRILL OIL 500 MG CAPS Take by mouth.  . Multiple Vitamin (MULTIVITAMIN WITH MINERALS) TABS Take 1  tablet by mouth daily.  . nitroGLYCERIN (NITROSTAT) 0.4 MG SL tablet PLACE 1 TABLET UNDER THE TOUNGE EVERY 5 MINUTES AS NNEDED FOR CHEST PAIN. MAX 3 DOSES  . SILDENAFIL CITRATE PO Take by mouth as needed.  . tamsulosin (FLOMAX) 0.4 MG CAPS capsule Take 0.4 mg by mouth daily.   Current Facility-Administered Medications for the 11/20/18 encounter (Office Visit) with Quintella Reichert, MD  Medication  . triamcinolone acetonide (KENALOG) 10 MG/ML injection 10 mg     Allergies:   Other and Shrimp [shellfish allergy]   Social History   Socioeconomic History  . Marital status: Married    Spouse name: Not on file  . Number of children: Not on file  . Years of education: Not on file  . Highest education level: Not on file  Occupational History  . Not on file  Social Needs  . Financial resource strain: Not on file  . Food insecurity    Worry: Not on file    Inability: Not on file  . Transportation needs    Medical: Not on file    Non-medical: Not on file  Tobacco Use  . Smoking status: Never Smoker  . Smokeless tobacco: Never Used  Substance and Sexual Activity  . Alcohol use: Yes    Comment: occasionally  . Drug use: No  .  Sexual activity: Not on file  Lifestyle  . Physical activity    Days per week: Not on file    Minutes per session: Not on file  . Stress: Not on file  Relationships  . Social Musicianconnections    Talks on phone: Not on file    Gets together: Not on file    Attends religious service: Not on file    Active member of club or organization: Not on file    Attends meetings of clubs or organizations: Not on file    Relationship status: Not on file  Other Topics Concern  . 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  ROS:   Please see the history of present illness.    ROS  All other systems reviewed and negative.   EKGs/Labs/Other Studies Reviewed:    The following studies were reviewed today: none  EKG:  EKG is  ordered today.  The ekg ordered today demonstrates NSR with no ST changes  Recent Labs: No results found for requested labs within last 8760 hours.   Recent Lipid Panel    Component Value Date/Time   CHOL 114 08/24/2017 0000   CHOL 106 02/01/2013 0800   TRIG 45 08/24/2017 0000   TRIG 45 02/01/2013 0800   HDL 38 (L) 08/24/2017 0000   HDL 40 02/01/2013 0800   CHOLHDL 3.0 08/24/2017 0000   CHOLHDL 3.3 03/04/2016 0811   VLDL 13 03/04/2016 0811   LDLCALC 67 08/24/2017 0000   LDLCALC 57 02/01/2013 0800   LDLDIRECT 58.4 04/17/2013 0739    Physical Exam:    VS:  BP 128/80   Ht 5\' 10"  (1.778 m)   Wt 212 lb (96.2 kg)   BMI 30.42 kg/m     Wt Readings from Last 3 Encounters:  11/20/18 212 lb (96.2 kg)  08/31/17 211 lb (95.7 kg)  08/31/16 211 lb 12.8 oz (96.1 kg)     GEN:  Well nourished, well developed in no acute distress HEENT: Normal NECK: No JVD; No carotid bruits LYMPHATICS: No lymphadenopathy CARDIAC: RRR, no murmurs, rubs, gallops RESPIRATORY:  Clear to auscultation without rales, wheezing or rhonchi  ABDOMEN: Soft, non-tender, non-distended MUSCULOSKELETAL:  No edema; No deformity  SKIN: Warm and dry NEUROLOGIC:  Alert and oriented x 3 PSYCHIATRIC:  Normal affect   ASSESSMENT:    1. Coronary artery disease involving native coronary artery of native heart without angina pectoris   2. Pure hypercholesterolemia   3. Vasovagal syncope    PLAN:    In order of problems listed above:  1.  ASCAD - cath with subtotalled RCA with borderline 50-70% mid LAD s/p PCI of RCA 2014.  He has not had any anginal sx.  He will continue on ASA 81mg  daily and statin.  2.  Hyperlipidemia - LDL goal is < 70. LDL was 71 in July and ALT  normal.  Continue on Atorvastatin 80mg  daily.    3.  Vasovagal syncope - he has not had any further syncope or dizziness.   Medication Adjustments/Labs and Tests Ordered: Current medicines are reviewed at length with the patient today.  Concerns regarding medicines are outlined above.  Orders Placed This Encounter  Procedures  .  EKG 12-Lead   No orders of the defined types were placed in this encounter.   Signed, Fransico Him, MD  11/20/2018 9:27 AM    [place]

## 2018-11-20 ENCOUNTER — Encounter: Payer: Self-pay | Admitting: Cardiology

## 2018-11-20 ENCOUNTER — Ambulatory Visit (INDEPENDENT_AMBULATORY_CARE_PROVIDER_SITE_OTHER): Payer: Medicare Other | Admitting: Cardiology

## 2018-11-20 ENCOUNTER — Other Ambulatory Visit: Payer: Self-pay

## 2018-11-20 VITALS — BP 128/80 | HR 60 | Ht 70.0 in | Wt 212.0 lb

## 2018-11-20 DIAGNOSIS — E7800 Pure hypercholesterolemia, unspecified: Secondary | ICD-10-CM

## 2018-11-20 DIAGNOSIS — I2510 Atherosclerotic heart disease of native coronary artery without angina pectoris: Secondary | ICD-10-CM

## 2018-11-20 DIAGNOSIS — R55 Syncope and collapse: Secondary | ICD-10-CM

## 2018-11-20 MED ORDER — NITROGLYCERIN 0.4 MG SL SUBL: SUBLINGUAL_TABLET | SUBLINGUAL | 4 refills | Status: DC

## 2018-11-20 NOTE — Patient Instructions (Signed)
Medication Instructions:  Your physician recommends that you continue on your current medications as directed. Please refer to the Current Medication list given to you today.  If you need a refill on your cardiac medications before your next appointment, please call your pharmacy.   Lab work: None   If you have labs (blood work) drawn today and your tests are completely normal, you will receive your results only by: . MyChart Message (if you have MyChart) OR . A paper copy in the mail If you have any lab test that is abnormal or we need to change your treatment, we will call you to review the results.  Testing/Procedures: None   Follow-Up: At [facility], you and your health needs are our priority.  As part of our continuing mission to provide you with exceptional heart care, we have created designated Provider Care Teams.  These Care Teams include your primary Cardiologist (physician) and Advanced Practice Providers (APPs -  Physician Assistants and Nurse Practitioners) who all work together to provide you with the care you need, when you need it. You will need a follow up appointment in 12 months.  Please call our office 2 months in advance to schedule this appointment.  You may see Dr. Turner or one of the following Advanced Practice Providers on your designated Care Team:   Brittainy Simmons, PA-C Dayna Dunn, PA-C . Michele Lenze, PA-C  Any Other Special Instructions Will Be Listed Below (If Applicable).  

## 2018-11-27 DIAGNOSIS — Z961 Presence of intraocular lens: Secondary | ICD-10-CM | POA: Diagnosis not present

## 2018-12-18 DIAGNOSIS — Z23 Encounter for immunization: Secondary | ICD-10-CM | POA: Diagnosis not present

## 2019-01-14 ENCOUNTER — Other Ambulatory Visit: Payer: Self-pay | Admitting: Cardiology

## 2019-02-10 DIAGNOSIS — N400 Enlarged prostate without lower urinary tract symptoms: Secondary | ICD-10-CM | POA: Diagnosis not present

## 2019-02-10 DIAGNOSIS — I2510 Atherosclerotic heart disease of native coronary artery without angina pectoris: Secondary | ICD-10-CM | POA: Diagnosis not present

## 2019-02-10 DIAGNOSIS — E7800 Pure hypercholesterolemia, unspecified: Secondary | ICD-10-CM | POA: Diagnosis not present

## 2019-02-10 DIAGNOSIS — E782 Mixed hyperlipidemia: Secondary | ICD-10-CM | POA: Diagnosis not present

## 2019-10-14 DIAGNOSIS — E7800 Pure hypercholesterolemia, unspecified: Secondary | ICD-10-CM

## 2019-11-12 ENCOUNTER — Other Ambulatory Visit: Payer: Medicare Other | Admitting: *Deleted

## 2019-11-12 ENCOUNTER — Other Ambulatory Visit: Payer: Self-pay

## 2019-11-12 DIAGNOSIS — E7800 Pure hypercholesterolemia, unspecified: Secondary | ICD-10-CM

## 2019-11-12 LAB — LIPID PANEL
Chol/HDL Ratio: 3 ratio (ref 0.0–5.0)
Cholesterol, Total: 119 mg/dL (ref 100–199)
HDL: 40 mg/dL (ref >39)
LDL Chol Calc (NIH): 67 mg/dL (ref 0–99)
Triglycerides: 53 mg/dL (ref 0–149)
VLDL Cholesterol Cal: 12 mg/dL (ref 5–40)

## 2019-11-12 LAB — ALT: ALT: 42 IU/L (ref 0–44)

## 2019-11-21 NOTE — Progress Notes (Signed)
Cardiology Office Note:    Date:  11/22/2019   ID:  Stephen Curtis, DOB 06/09/1950, MRN [id number]  PCP:  Stephen Gosselin, MD  Cardiologist:  Stephen Magic, MD    Referring MD: Stephen Gosselin, MD   Chief Complaint  Patient presents with  . Coronary Artery Disease  . Hyperlipidemia    History of Present Illness:    Stephen Curtis is a 69 y.o. male with a hx of CADand cath with subtotalled RCA with borderline 50-70% mid LAD s/p PCI of RCA 2014, dyslipidemia and vasovagal syncope.  He is here today for followup and is doing well.  He denies any chest pain or pressure, SOB, DOE, PND, orthopnea, LE edema, dizziness, palpitations or syncope. He is compliant with his meds and is tolerating meds with no SE.      Past Medical History:  Diagnosis Date  . BPH (benign prostatic hypertrophy)   . CAD (coronary artery disease)    subtotalled RCA wth borderline 50-70% mid LAD s/p DES to RCA  . Hyperlipidemia LDL goal <70   . Retinopathy   . Vasovagal syncope   . Vertigo    hearing loss left ear evaluated by ENT Dr. Jearld Fenton    Past Surgical History:  Procedure Laterality Date  . CARDIAC CATHETERIZATION    . FLEXIBLE SIGMOIDOSCOPY    . knee arthroscopic Left   . LEFT HEART CATHETERIZATION WITH CORONARY ANGIOGRAM N/A 05/17/2012   Procedure: LEFT HEART CATHETERIZATION WITH CORONARY ANGIOGRAM;  Surgeon: Lesleigh Noe, MD;  Location: [facility] CATH LAB;  Service: Cardiovascular;  Laterality: N/A;    Current Medications: Current Meds  Medication Sig  . aspirin EC 81 MG tablet Take 81 mg by mouth daily.  Marland Kitchen atorvastatin (LIPITOR) 80 MG tablet Take 1 tablet (80 mg total) by mouth daily at 6 PM.  . cetirizine (ZYRTEC) 10 MG tablet Take 10 mg by mouth daily.  . Cholecalciferol (VITAMIN D) 125 MCG (5000 UT) CAPS   . finasteride (PROSCAR) 5 MG tablet Take 5 mg by mouth daily.  Marland Kitchen MEGARED OMEGA-3 KRILL OIL 500 MG CAPS Take by mouth.  . Multiple Vitamin (MULTIVITAMIN WITH MINERALS) TABS Take 1 tablet  by mouth daily.  . nitroGLYCERIN (NITROSTAT) 0.4 MG SL tablet PLACE 1 TABLET UNDER THE TOUNGE EVERY 5 MINUTES AS NNEDED FOR CHEST PAIN. MAX 3 DOSES  . SILDENAFIL CITRATE PO Take by mouth as needed.  . tamsulosin (FLOMAX) 0.4 MG CAPS capsule Take 0.4 mg by mouth daily.  . [DISCONTINUED] atorvastatin (LIPITOR) 80 MG tablet Take 1 tablet (80 mg total) by mouth daily at 6 PM.   Current Facility-Administered Medications for the 11/22/19 encounter (Office Visit) with Quintella Reichert, MD  Medication  . triamcinolone acetonide (KENALOG) 10 MG/ML injection 10 mg     Allergies:   Patient has no active allergies.   Social History   Socioeconomic History  . Marital status: Married    Spouse name: Not on file  . Number of children: Not on file  . Years of education: Not on file  . Highest education level: Not on file  Occupational History  . Not on file  Tobacco Use  . Smoking status: Never Smoker  . Smokeless tobacco: Never Used  Substance and Sexual Activity  . Alcohol use: Yes    Comment: occasionally  . Drug use: No  . Sexual activity: Not on file  Other Topics Concern  . 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 Difficulty of Paying Living Expenses: Not on file  Food Insecurity:   . Worried About Programme researcher, broadcasting/film/video in the Last Year: Not on file  . Ran Out of Food in the Last Year: Not on file  Transportation Needs:   . Lack of Transportation (Medical): Not on file  . Lack of Transportation (Non-Medical): Not on file  Physical Activity:   . Days of Exercise per Week: Not on file  . Minutes of Exercise per Session: Not on file  Stress:   . Feeling of Stress : Not on file  Social Connections:   . Frequency of Communication with Friends and Family: Not on file  . Frequency of Social Gatherings with Friends and Family: Not on file  . Attends Religious Services: Not on  file  . Active Member of Clubs or Organizations: Not on file  . Attends Banker Meetings: Not on file  . Marital Status: Not on file     Family History: The patient's family history includes Cancer in his father; Colon cancer in his sister; Diabetes in an other family member; Heart attack in his mother; Heart disease in his mother; Hypertension in an other family member.  ROS:   Please see the history of present illness.    ROS  All other systems reviewed and negative.   EKGs/Labs/Other Studies Reviewed:    The following studies were reviewed today: none  EKG:  EKG is  ordered today.  The ekg ordered today demonstrates NSR with no ST changes  Recent Labs: 11/12/2019: ALT 42   Recent Lipid Panel    Component Value Date/Time   CHOL 119 11/12/2019 0736   CHOL 106 02/01/2013 0800   TRIG 53 11/12/2019 0736   TRIG 45 02/01/2013 0800   HDL 40 11/12/2019 0736   HDL 40 02/01/2013 0800   CHOLHDL 3.0 11/12/2019 0736   CHOLHDL 3.3 03/04/2016 0811   VLDL 13 03/04/2016 0811   LDLCALC 67 11/12/2019 0736   LDLCALC 57 02/01/2013 0800   LDLDIRECT 58.4 04/17/2013 0739    Physical Exam:    VS:  BP (!) 172/88   Pulse 64   Ht 5\' 10"  (1.778 m)   Wt 211 lb (95.7 kg)   SpO2 98%   BMI 30.28 kg/m     Wt Readings from Last 3 Encounters:  11/22/19 211 lb (95.7 kg)  11/20/18 212 lb (96.2 kg)  08/31/17 211 lb (95.7 kg)     GEN: Well nourished, well developed in no acute distress HEENT: Normal NECK: No JVD; No carotid bruits LYMPHATICS: No lymphadenopathy CARDIAC:RRR, no murmurs, rubs, gallops RESPIRATORY:  Clear to auscultation without rales, wheezing or rhonchi  ABDOMEN: Soft, non-tender, non-distended MUSCULOSKELETAL:  No edema; No deformity  SKIN: Warm and dry NEUROLOGIC:  Alert and oriented x 3 PSYCHIATRIC:  Normal affect   ASSESSMENT:    1. Coronary artery disease involving native coronary artery of native heart without angina pectoris   2. Pure  hypercholesterolemia   3. Vasovagal syncope   4. Essential hypertension    PLAN:    In order of problems listed above:  1.  ASCAD  -cath with subtotalled RCA with borderline 50-70% mid LAD s/p PCI of RCA 2014.   -he denies any anginal symptoms -continue ASA and statin  2.  Hyperlipidemia  -LDL goal is < 70.  -LDL was 67 on 11/12/2019 -continue  atorvastatin 80mg  daily  3.  Vasovagal syncope  - he has not had any further syncope or dizziness.  4.  HTN -His BP has been trending upward since I saw him last -he does not think that he has changed his diet any recently -Will start Losartan 25mg  daily and get BMET in 1 week -check BP daily for a week and call with results  Medication Adjustments/Labs and Tests Ordered: Current medicines are reviewed at length with the patient today.  Concerns regarding medicines are outlined above.  Orders Placed This Encounter  Procedures  . Basic metabolic panel  . EKG 12-Lead   Meds ordered this encounter  Medications  . losartan (COZAAR) 25 MG tablet    Sig: Take 1 tablet (25 mg total) by mouth daily.    Dispense:  30 tablet    Refill:  11  . atorvastatin (LIPITOR) 80 MG tablet    Sig: Take 1 tablet (80 mg total) by mouth daily at 6 PM.    Dispense:  90 tablet    Refill:  3    Requesting 1 year supply    Signed, , MD  11/22/2019 8:45 AM    [facility]

## 2019-11-22 ENCOUNTER — Other Ambulatory Visit: Payer: Self-pay

## 2019-11-22 ENCOUNTER — Encounter: Payer: Self-pay | Admitting: Cardiology

## 2019-11-22 ENCOUNTER — Ambulatory Visit (INDEPENDENT_AMBULATORY_CARE_PROVIDER_SITE_OTHER): Payer: Medicare Other | Admitting: Cardiology

## 2019-11-22 VITALS — BP 172/88 | HR 64 | Ht 70.0 in | Wt 211.0 lb

## 2019-11-22 DIAGNOSIS — E7800 Pure hypercholesterolemia, unspecified: Secondary | ICD-10-CM

## 2019-11-22 DIAGNOSIS — I2510 Atherosclerotic heart disease of native coronary artery without angina pectoris: Secondary | ICD-10-CM | POA: Diagnosis not present

## 2019-11-22 DIAGNOSIS — R55 Syncope and collapse: Secondary | ICD-10-CM

## 2019-11-22 DIAGNOSIS — I10 Essential (primary) hypertension: Secondary | ICD-10-CM | POA: Diagnosis not present

## 2019-11-22 MED ORDER — ATORVASTATIN CALCIUM 80 MG PO TABS: 80.0000 mg | ORAL_TABLET | Freq: Every day | ORAL | 3 refills | Status: DC

## 2019-11-22 MED ORDER — LOSARTAN POTASSIUM 25 MG PO TABS
25.0000 mg | ORAL_TABLET | Freq: Every day | ORAL | 11 refills | Status: DC
Start: 2019-11-22 — End: 2019-11-29

## 2019-11-22 NOTE — Patient Instructions (Signed)
Please take your blood pressure daily for one week at lunch time and send [place] a message with your readings.   Medication Instructions:  Your physician has recommended you make the following change in your medication: 1) START taking losartan 25 mg daily  *If you need a refill on your cardiac medications before your next appointment, please call your pharmacy*   Lab Work: BMET in one week  If you have labs (blood work) drawn today and your tests are completely normal, you will receive your results only by: Marland Kitchen MyChart Message (if you have MyChart) OR . A paper copy in the mail If you have any lab test that is abnormal or we need to change your treatment, we will call you to review the results.  Follow-Up: At [facility], you and your health needs are our priority.  As part of our continuing mission to provide you with exceptional heart care, we have created designated Provider Care Teams.  These Care Teams include your primary Cardiologist (physician) and Advanced Practice Providers (APPs -  Physician Assistants and Nurse Practitioners) who all work together to provide you with the care you need, when you need it.  Your next appointment:   1 year(s)  The format for your next appointment:   In Person  Provider:   You may see Armanda Magic, MD or one of the following Advanced Practice Providers on your designated Care Team:    Ronie Spies, PA-C  Jacolyn Reedy, PA-C

## 2019-11-29 ENCOUNTER — Other Ambulatory Visit: Payer: Medicare Other | Admitting: *Deleted

## 2019-11-29 ENCOUNTER — Other Ambulatory Visit: Payer: Self-pay

## 2019-11-29 DIAGNOSIS — Z79899 Other long term (current) drug therapy: Secondary | ICD-10-CM

## 2019-11-29 DIAGNOSIS — I10 Essential (primary) hypertension: Secondary | ICD-10-CM

## 2019-11-29 LAB — BASIC METABOLIC PANEL
BUN/Creatinine Ratio: 16 (ref 10–24)
BUN: 16 mg/dL (ref 8–27)
CO2: 26 mmol/L (ref 20–29)
Calcium: 9.7 mg/dL (ref 8.6–10.2)
Chloride: 103 mmol/L (ref 96–106)
Creatinine, Ser: 0.98 mg/dL (ref 0.76–1.27)
GFR calc Af Amer: 91 mL/min/{1.73_m2} (ref >59)
GFR calc non Af Amer: 79 mL/min/{1.73_m2} (ref >59)
Glucose: 86 mg/dL (ref 65–99)
Potassium: 4.1 mmol/L (ref 3.5–5.2)
Sodium: 141 mmol/L (ref 134–144)

## 2019-11-29 MED ORDER — LOSARTAN POTASSIUM 25 MG PO TABS: 50.0000 mg | ORAL_TABLET | Freq: Every day | ORAL | 3 refills | Status: DC

## 2019-11-29 NOTE — Telephone Encounter (Signed)
Per Dr. Mayford Knife, BP control borderline - increase Losartan to 50mg  daily and check Bp daily for a week and call with results. Check BMET in 1 week . Patient will come in on 12/06/19 for lab work. Sent increase dose to patient's Pharmacy of choice. Patient will send another mychart message of BP readings next Friday. Patient agreed to plan.

## 2019-12-06 ENCOUNTER — Other Ambulatory Visit: Payer: Medicare Other

## 2019-12-06 ENCOUNTER — Other Ambulatory Visit: Payer: Self-pay

## 2019-12-06 ENCOUNTER — Other Ambulatory Visit: Payer: Medicare Other | Admitting: *Deleted

## 2019-12-06 DIAGNOSIS — Z79899 Other long term (current) drug therapy: Secondary | ICD-10-CM

## 2019-12-06 DIAGNOSIS — I10 Essential (primary) hypertension: Secondary | ICD-10-CM

## 2019-12-06 LAB — BASIC METABOLIC PANEL
BUN/Creatinine Ratio: 15 (ref 10–24)
BUN: 15 mg/dL (ref 8–27)
CO2: 25 mmol/L (ref 20–29)
Calcium: 9.5 mg/dL (ref 8.6–10.2)
Chloride: 105 mmol/L (ref 96–106)
Creatinine, Ser: 1.01 mg/dL (ref 0.76–1.27)
GFR calc Af Amer: 88 mL/min/{1.73_m2} (ref >59)
GFR calc non Af Amer: 76 mL/min/{1.73_m2} (ref >59)
Glucose: 98 mg/dL (ref 65–99)
Potassium: 4 mmol/L (ref 3.5–5.2)
Sodium: 142 mmol/L (ref 134–144)

## 2019-12-07 ENCOUNTER — Other Ambulatory Visit: Payer: Self-pay | Admitting: Cardiology

## 2019-12-19 ENCOUNTER — Other Ambulatory Visit: Payer: Self-pay | Admitting: Cardiology

## 2020-03-18 DIAGNOSIS — M67912 Unspecified disorder of synovium and tendon, left shoulder: Secondary | ICD-10-CM | POA: Diagnosis not present

## 2020-03-24 DIAGNOSIS — M67912 Unspecified disorder of synovium and tendon, left shoulder: Secondary | ICD-10-CM | POA: Diagnosis not present

## 2020-03-24 DIAGNOSIS — M6281 Muscle weakness (generalized): Secondary | ICD-10-CM | POA: Diagnosis not present

## 2020-04-02 DIAGNOSIS — M67912 Unspecified disorder of synovium and tendon, left shoulder: Secondary | ICD-10-CM | POA: Diagnosis not present

## 2020-04-02 DIAGNOSIS — M6281 Muscle weakness (generalized): Secondary | ICD-10-CM | POA: Diagnosis not present

## 2020-04-07 DIAGNOSIS — M67912 Unspecified disorder of synovium and tendon, left shoulder: Secondary | ICD-10-CM | POA: Diagnosis not present

## 2020-04-07 DIAGNOSIS — M6281 Muscle weakness (generalized): Secondary | ICD-10-CM | POA: Diagnosis not present

## 2020-04-09 DIAGNOSIS — M6281 Muscle weakness (generalized): Secondary | ICD-10-CM | POA: Diagnosis not present

## 2020-04-09 DIAGNOSIS — M67912 Unspecified disorder of synovium and tendon, left shoulder: Secondary | ICD-10-CM | POA: Diagnosis not present

## 2020-04-14 DIAGNOSIS — M67912 Unspecified disorder of synovium and tendon, left shoulder: Secondary | ICD-10-CM | POA: Diagnosis not present

## 2020-04-14 DIAGNOSIS — M6281 Muscle weakness (generalized): Secondary | ICD-10-CM | POA: Diagnosis not present

## 2020-04-21 DIAGNOSIS — M6281 Muscle weakness (generalized): Secondary | ICD-10-CM | POA: Diagnosis not present

## 2020-04-21 DIAGNOSIS — M67912 Unspecified disorder of synovium and tendon, left shoulder: Secondary | ICD-10-CM | POA: Diagnosis not present

## 2020-04-22 DIAGNOSIS — K219 Gastro-esophageal reflux disease without esophagitis: Secondary | ICD-10-CM | POA: Diagnosis not present

## 2020-04-22 DIAGNOSIS — E7800 Pure hypercholesterolemia, unspecified: Secondary | ICD-10-CM | POA: Diagnosis not present

## 2020-04-22 DIAGNOSIS — E782 Mixed hyperlipidemia: Secondary | ICD-10-CM | POA: Diagnosis not present

## 2020-04-22 DIAGNOSIS — I10 Essential (primary) hypertension: Secondary | ICD-10-CM | POA: Diagnosis not present

## 2020-04-22 DIAGNOSIS — I2510 Atherosclerotic heart disease of native coronary artery without angina pectoris: Secondary | ICD-10-CM | POA: Diagnosis not present

## 2020-04-23 DIAGNOSIS — M67912 Unspecified disorder of synovium and tendon, left shoulder: Secondary | ICD-10-CM | POA: Diagnosis not present

## 2020-04-23 DIAGNOSIS — M6281 Muscle weakness (generalized): Secondary | ICD-10-CM | POA: Diagnosis not present

## 2020-04-28 DIAGNOSIS — M6281 Muscle weakness (generalized): Secondary | ICD-10-CM | POA: Diagnosis not present

## 2020-04-28 DIAGNOSIS — M67912 Unspecified disorder of synovium and tendon, left shoulder: Secondary | ICD-10-CM | POA: Diagnosis not present

## 2020-05-06 DIAGNOSIS — M67912 Unspecified disorder of synovium and tendon, left shoulder: Secondary | ICD-10-CM | POA: Diagnosis not present

## 2020-05-06 DIAGNOSIS — M6281 Muscle weakness (generalized): Secondary | ICD-10-CM | POA: Diagnosis not present

## 2020-05-08 DIAGNOSIS — M67912 Unspecified disorder of synovium and tendon, left shoulder: Secondary | ICD-10-CM | POA: Diagnosis not present

## 2020-05-08 DIAGNOSIS — M6281 Muscle weakness (generalized): Secondary | ICD-10-CM | POA: Diagnosis not present

## 2020-05-12 DIAGNOSIS — M6281 Muscle weakness (generalized): Secondary | ICD-10-CM | POA: Diagnosis not present

## 2020-05-12 DIAGNOSIS — M67912 Unspecified disorder of synovium and tendon, left shoulder: Secondary | ICD-10-CM | POA: Diagnosis not present

## 2020-05-14 DIAGNOSIS — M67912 Unspecified disorder of synovium and tendon, left shoulder: Secondary | ICD-10-CM | POA: Diagnosis not present

## 2020-05-14 DIAGNOSIS — M6281 Muscle weakness (generalized): Secondary | ICD-10-CM | POA: Diagnosis not present

## 2020-05-19 DIAGNOSIS — M6281 Muscle weakness (generalized): Secondary | ICD-10-CM | POA: Diagnosis not present

## 2020-05-19 DIAGNOSIS — M67912 Unspecified disorder of synovium and tendon, left shoulder: Secondary | ICD-10-CM | POA: Diagnosis not present

## 2020-05-26 DIAGNOSIS — M6281 Muscle weakness (generalized): Secondary | ICD-10-CM | POA: Diagnosis not present

## 2020-05-26 DIAGNOSIS — M67912 Unspecified disorder of synovium and tendon, left shoulder: Secondary | ICD-10-CM | POA: Diagnosis not present

## 2020-06-08 DIAGNOSIS — M6281 Muscle weakness (generalized): Secondary | ICD-10-CM | POA: Diagnosis not present

## 2020-06-08 DIAGNOSIS — M67912 Unspecified disorder of synovium and tendon, left shoulder: Secondary | ICD-10-CM | POA: Diagnosis not present

## 2020-07-23 DIAGNOSIS — B358 Other dermatophytoses: Secondary | ICD-10-CM | POA: Diagnosis not present

## 2020-08-15 ENCOUNTER — Other Ambulatory Visit: Payer: Self-pay | Admitting: Cardiology

## 2020-08-31 ENCOUNTER — Ambulatory Visit (INDEPENDENT_AMBULATORY_CARE_PROVIDER_SITE_OTHER): Payer: Medicare Other

## 2020-08-31 ENCOUNTER — Encounter: Payer: Self-pay | Admitting: Podiatry

## 2020-08-31 ENCOUNTER — Ambulatory Visit: Payer: BLUE CROSS/BLUE SHIELD | Admitting: Podiatry

## 2020-08-31 ENCOUNTER — Other Ambulatory Visit: Payer: Self-pay

## 2020-08-31 ENCOUNTER — Other Ambulatory Visit: Payer: Self-pay | Admitting: Podiatry

## 2020-08-31 DIAGNOSIS — M205X1 Other deformities of toe(s) (acquired), right foot: Secondary | ICD-10-CM

## 2020-08-31 DIAGNOSIS — M778 Other enthesopathies, not elsewhere classified: Secondary | ICD-10-CM | POA: Diagnosis not present

## 2020-08-31 DIAGNOSIS — M79671 Pain in right foot: Secondary | ICD-10-CM | POA: Diagnosis not present

## 2020-08-31 DIAGNOSIS — M722 Plantar fascial fibromatosis: Secondary | ICD-10-CM

## 2020-08-31 MED ORDER — TRIAMCINOLONE ACETONIDE 10 MG/ML IJ SUSP
10.0000 mg | Freq: Once | INTRAMUSCULAR | Status: AC
  Administered 2020-08-31: 10 mg

## 2020-09-02 NOTE — Progress Notes (Signed)
Subjective:   Patient ID: Stephen Curtis, male   DOB: 70 y.o.   MRN: [id number]   HPI Patient presents stating he has been having concerns and pain around his big toe joint again and was concerned about this and whether it is gotten worse and eventually whether surgery will be necessary.  Patient does not smoke likes to be active   Review of Systems  All other systems reviewed and are negative.      Objective:  Physical Exam Vitals and nursing note reviewed.  Constitutional:      Appearance: He is well-developed.  Pulmonary:     Effort: Pulmonary effort is normal.  Musculoskeletal:        General: Normal range of motion.  Skin:    General: Skin is warm.  Neurological:     Mental Status: He is alert.    Neurovascular status intact muscle strength adequate range of motion adequate with patient noted to have exquisite discomfort around the first MPJ right with fluid buildup around the joint surface that is painful when pressed.  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  Reappoint symptomatic  X-rays indicate spurring around the first MPJ right mild in intensity with mild elevation of the first metatarsal segment

## 2020-09-03 DIAGNOSIS — E782 Mixed hyperlipidemia: Secondary | ICD-10-CM | POA: Diagnosis not present

## 2020-09-03 DIAGNOSIS — I10 Essential (primary) hypertension: Secondary | ICD-10-CM | POA: Diagnosis not present

## 2020-09-03 DIAGNOSIS — E7800 Pure hypercholesterolemia, unspecified: Secondary | ICD-10-CM | POA: Diagnosis not present

## 2020-09-03 DIAGNOSIS — I2510 Atherosclerotic heart disease of native coronary artery without angina pectoris: Secondary | ICD-10-CM | POA: Diagnosis not present

## 2020-09-03 DIAGNOSIS — K219 Gastro-esophageal reflux disease without esophagitis: Secondary | ICD-10-CM | POA: Diagnosis not present

## 2020-10-25 DIAGNOSIS — E7800 Pure hypercholesterolemia, unspecified: Secondary | ICD-10-CM | POA: Diagnosis not present

## 2020-10-25 DIAGNOSIS — K219 Gastro-esophageal reflux disease without esophagitis: Secondary | ICD-10-CM | POA: Diagnosis not present

## 2020-10-25 DIAGNOSIS — I2510 Atherosclerotic heart disease of native coronary artery without angina pectoris: Secondary | ICD-10-CM | POA: Diagnosis not present

## 2020-10-25 DIAGNOSIS — I10 Essential (primary) hypertension: Secondary | ICD-10-CM | POA: Diagnosis not present

## 2020-10-25 DIAGNOSIS — E782 Mixed hyperlipidemia: Secondary | ICD-10-CM | POA: Diagnosis not present

## 2020-11-08 ENCOUNTER — Other Ambulatory Visit: Payer: Self-pay | Admitting: Cardiology

## 2020-11-09 DIAGNOSIS — I2510 Atherosclerotic heart disease of native coronary artery without angina pectoris: Secondary | ICD-10-CM | POA: Diagnosis not present

## 2020-11-09 DIAGNOSIS — I10 Essential (primary) hypertension: Secondary | ICD-10-CM | POA: Diagnosis not present

## 2020-11-09 DIAGNOSIS — R7301 Impaired fasting glucose: Secondary | ICD-10-CM | POA: Diagnosis not present

## 2020-11-09 DIAGNOSIS — M25561 Pain in right knee: Secondary | ICD-10-CM | POA: Diagnosis not present

## 2020-11-09 DIAGNOSIS — Z0000 Encounter for general adult medical examination without abnormal findings: Secondary | ICD-10-CM | POA: Diagnosis not present

## 2020-11-09 DIAGNOSIS — K219 Gastro-esophageal reflux disease without esophagitis: Secondary | ICD-10-CM | POA: Diagnosis not present

## 2020-11-09 DIAGNOSIS — E7800 Pure hypercholesterolemia, unspecified: Secondary | ICD-10-CM | POA: Diagnosis not present

## 2020-11-11 ENCOUNTER — Other Ambulatory Visit: Payer: Self-pay | Admitting: Sports Medicine

## 2020-11-11 ENCOUNTER — Ambulatory Visit
Admission: RE | Admit: 2020-11-11 | Discharge: 2020-11-11 | Disposition: A | Discharge status: Home / Self Care | Payer: Medicare Other | Source: Ambulatory Visit | Attending: Sports Medicine | Admitting: Sports Medicine

## 2020-11-11 DIAGNOSIS — M25561 Pain in right knee: Secondary | ICD-10-CM | POA: Diagnosis not present

## 2020-11-18 ENCOUNTER — Ambulatory Visit: Payer: Medicare Other | Admitting: Cardiology

## 2020-11-18 ENCOUNTER — Other Ambulatory Visit: Payer: Self-pay

## 2020-11-18 VITALS — BP 132/80 | HR 58 | Ht 70.0 in | Wt 216.0 lb

## 2020-11-18 DIAGNOSIS — R55 Syncope and collapse: Secondary | ICD-10-CM | POA: Diagnosis not present

## 2020-11-18 DIAGNOSIS — E7800 Pure hypercholesterolemia, unspecified: Secondary | ICD-10-CM

## 2020-11-18 DIAGNOSIS — I2510 Atherosclerotic heart disease of native coronary artery without angina pectoris: Secondary | ICD-10-CM

## 2020-11-18 DIAGNOSIS — I10 Essential (primary) hypertension: Secondary | ICD-10-CM | POA: Diagnosis not present

## 2020-11-18 MED ORDER — ATORVASTATIN CALCIUM 80 MG PO TABS: ORAL_TABLET | ORAL | 3 refills | Status: DC

## 2020-11-18 MED ORDER — LOSARTAN POTASSIUM 25 MG PO TABS: 50.0000 mg | ORAL_TABLET | Freq: Every day | ORAL | 3 refills | Status: DC

## 2020-11-18 MED ORDER — NITROGLYCERIN 0.4 MG SL SUBL: SUBLINGUAL_TABLET | SUBLINGUAL | 4 refills | Status: DC

## 2020-11-18 NOTE — Patient Instructions (Signed)

## 2020-11-18 NOTE — Addendum Note (Signed)
Addended by: Theresia Majors on: 11/18/2020 09:23 AM   Modules accepted: Orders

## 2020-11-18 NOTE — Progress Notes (Signed)
Cardiology Office Note:    Date:  11/18/2020   ID:  Stephen Curtis, DOB 1950/10/14, MRN [id number]  PCP:  Sigmund Hazel, MD  Cardiologist:  Armanda Magic, MD    Referring MD: No ref. provider found   Chief Complaint  Patient presents with   Coronary Artery Disease   Hyperlipidemia    History of Present Illness:    Stephen Curtis is a 70 y.o. male with a hx of CAD and cath with subtotalled RCA with borderline 50-70% mid LAD s/p PCI of RCA 2014, dyslipidemia and vasovagal syncope.  He is here today for followup and is doing well.  He denies any chest pain or pressure, SOB, DOE, PND, orthopnea, LE edema, dizziness, palpitations or syncope. He is compliant with his meds and is tolerating meds with no SE.     Past Medical History:  Diagnosis Date   BPH (benign prostatic hypertrophy)    CAD (coronary artery disease)    subtotalled RCA wth borderline 50-70% mid LAD s/p DES to RCA   Hyperlipidemia LDL goal <70    Retinopathy    Vasovagal syncope    Vertigo    hearing loss left ear evaluated by ENT Dr. Jearld Fenton    Past Surgical History:  Procedure Laterality Date   CARDIAC CATHETERIZATION     FLEXIBLE SIGMOIDOSCOPY     knee arthroscopic Left    LEFT HEART CATHETERIZATION WITH CORONARY ANGIOGRAM N/A 05/17/2012   Procedure: LEFT HEART CATHETERIZATION WITH CORONARY ANGIOGRAM;  Surgeon: Lesleigh Noe, MD;  Location: [facility] CATH LAB;  Service: Cardiovascular;  Laterality: N/A;    Current Medications: Current Meds  Medication Sig   aspirin EC 81 MG tablet Take 81 mg by mouth daily.   cetirizine (ZYRTEC) 10 MG tablet Take 10 mg by mouth daily.   Cholecalciferol (VITAMIN D) 125 MCG (5000 UT) CAPS    finasteride (PROSCAR) 5 MG tablet Take 5 mg by mouth daily.   losartan (COZAAR) 25 MG tablet TAKE 2 TABLETS BY MOUTH  DAILY   MEGARED OMEGA-3 KRILL OIL 500 MG CAPS Take by mouth.   nitroGLYCERIN (NITROSTAT) 0.4 MG SL tablet PLACE 1 TABLET UNDER THE TOUNGE EVERY 5 MINUTES AS NNEDED FOR  CHEST PAIN. MAX 3 DOSES   SILDENAFIL CITRATE PO Take by mouth as needed.   tamsulosin (FLOMAX) 0.4 MG CAPS capsule Take 0.4 mg by mouth daily.   [DISCONTINUED] atorvastatin (LIPITOR) 80 MG tablet TAKE 1 TABLET BY MOUTH  DAILY AT 6 PM   Current Facility-Administered Medications for the 11/18/20 encounter (Office Visit) with Quintella Reichert, MD  Medication   triamcinolone acetonide (KENALOG) 10 MG/ML injection 10 mg     Allergies:   Patient has no active allergies.   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  ROS:   Please see the history of present illness.    ROS  All other systems reviewed and negative.   EKGs/Labs/Other Studies Reviewed:    The following studies were reviewed today: none  EKG:  EKG is  ordered today.  The ekg ordered today demonstrates Sinus bradycardia at 58bpm with no ST changes  Recent Labs: 12/06/2019: BUN 15; Creatinine, Ser 1.01; Potassium 4.0; Sodium 142   Recent Lipid Panel    Component Value  Date/Time   CHOL 119 11/12/2019 0736   CHOL 106 02/01/2013 0800   TRIG 53 11/12/2019 0736   TRIG 45 02/01/2013 0800   HDL 40 11/12/2019 0736   HDL 40 02/01/2013 0800   CHOLHDL 3.0 11/12/2019 0736   CHOLHDL 3.3 03/04/2016 0811   VLDL 13 03/04/2016 0811   LDLCALC 67 11/12/2019 0736   LDLCALC 57 02/01/2013 0800   LDLDIRECT 58.4 04/17/2013 0739    Physical Exam:    VS:  BP 132/80   Pulse (!) 58   Ht 5\' 10"  (1.778 m)   Wt 216 lb (98 kg)   SpO2 97%   BMI 30.99 kg/m     Wt Readings from Last 3 Encounters:  11/18/20 216 lb (98 kg)  11/22/19 211 lb (95.7 kg)  11/20/18 212 lb (96.2 kg)    GEN: Well nourished, well developed in no acute distress HEENT: Normal NECK: No JVD; No carotid bruits LYMPHATICS: No lymphadenopathy CARDIAC:RRR, no murmurs, rubs, gallops RESPIRATORY:  Clear to auscultation without rales, wheezing or rhonchi  ABDOMEN: Soft, non-tender, non-distended MUSCULOSKELETAL:  No edema; No deformity  SKIN: Warm and dry NEUROLOGIC:  Alert and oriented x 3 PSYCHIATRIC:  Normal affect   ASSESSMENT:    1. Coronary artery disease involving native coronary artery of native heart without angina pectoris   2. Pure hypercholesterolemia   3. Vasovagal syncope   4. Hypertension, unspecified type    PLAN:    In order of problems listed above:  1.  ASCAD  -cath with subtotalled RCA with borderline 50-70% mid LAD s/p PCI of RCA 2014.   -he has not had any anginal symptoms since I saw him last -continue ASA and statin  2.  Hyperlipidemia  -LDL goal is < 70.  -I have personally reviewed and interpreted outside labs performed by patient's PCP which showed LDL 61, HDL 32, TAGs 49 and ALT 26 in Aug 2022  -Continue prescription drug management with Atorvastatin 80mg  daily > refilled   3.  Vasovagal syncope  -no further episodes of dizziness or syncope  4.  HTN -BP is controlled on exam today -Continue prescription drug management with Losartan 25mg  daily >refilled  -I  have personally reviewed and interpreted outside performed by patient's PCP which showed Scr 0.93 and K+ 4.5 in Aug 2022   Medication Adjustments/Labs and Tests Ordered: Current medicines are reviewed at length with the patient today.  Concerns regarding medicines are outlined above.  Orders Placed This Encounter  Procedures   EKG 12-Lead    Meds ordered this encounter  Medications   atorvastatin (LIPITOR) 80 MG tablet  Sig: TAKE 1 TABLET BY MOUTH  DAILY AT 6 PM    Dispense:  90 tablet    Refill:  3    Requesting 1 year supply     Signed, Armanda Magic, MD  11/18/2020 9:15 AM    [facility]

## 2020-12-01 DIAGNOSIS — Z23 Encounter for immunization: Secondary | ICD-10-CM | POA: Diagnosis not present

## 2021-02-09 DIAGNOSIS — E7800 Pure hypercholesterolemia, unspecified: Secondary | ICD-10-CM | POA: Diagnosis not present

## 2021-02-09 DIAGNOSIS — E782 Mixed hyperlipidemia: Secondary | ICD-10-CM | POA: Diagnosis not present

## 2021-02-09 DIAGNOSIS — I10 Essential (primary) hypertension: Secondary | ICD-10-CM | POA: Diagnosis not present

## 2021-02-09 DIAGNOSIS — I2510 Atherosclerotic heart disease of native coronary artery without angina pectoris: Secondary | ICD-10-CM | POA: Diagnosis not present

## 2021-02-09 DIAGNOSIS — K219 Gastro-esophageal reflux disease without esophagitis: Secondary | ICD-10-CM | POA: Diagnosis not present

## 2021-02-22 DIAGNOSIS — R3911 Hesitancy of micturition: Secondary | ICD-10-CM | POA: Diagnosis not present

## 2021-04-20 DIAGNOSIS — K219 Gastro-esophageal reflux disease without esophagitis: Secondary | ICD-10-CM | POA: Diagnosis not present

## 2021-04-20 DIAGNOSIS — E7800 Pure hypercholesterolemia, unspecified: Secondary | ICD-10-CM | POA: Diagnosis not present

## 2021-04-20 DIAGNOSIS — I10 Essential (primary) hypertension: Secondary | ICD-10-CM | POA: Diagnosis not present

## 2021-05-21 DIAGNOSIS — Z20822 Contact with and (suspected) exposure to covid-19: Secondary | ICD-10-CM | POA: Diagnosis not present

## 2021-06-29 DIAGNOSIS — H04123 Dry eye syndrome of bilateral lacrimal glands: Secondary | ICD-10-CM | POA: Diagnosis not present

## 2021-06-29 DIAGNOSIS — H5203 Hypermetropia, bilateral: Secondary | ICD-10-CM | POA: Diagnosis not present

## 2021-06-29 DIAGNOSIS — H52203 Unspecified astigmatism, bilateral: Secondary | ICD-10-CM | POA: Diagnosis not present

## 2021-06-29 DIAGNOSIS — Z961 Presence of intraocular lens: Secondary | ICD-10-CM | POA: Diagnosis not present

## 2021-06-29 DIAGNOSIS — H524 Presbyopia: Secondary | ICD-10-CM | POA: Diagnosis not present

## 2021-07-11 ENCOUNTER — Other Ambulatory Visit: Payer: Self-pay | Admitting: Cardiology

## 2021-07-16 DIAGNOSIS — H905 Unspecified sensorineural hearing loss: Secondary | ICD-10-CM | POA: Diagnosis not present

## 2021-09-10 ENCOUNTER — Other Ambulatory Visit: Payer: Self-pay | Admitting: Cardiology

## 2021-09-29 DIAGNOSIS — R2241 Localized swelling, mass and lump, right lower limb: Secondary | ICD-10-CM | POA: Diagnosis not present

## 2021-10-01 DIAGNOSIS — R2241 Localized swelling, mass and lump, right lower limb: Secondary | ICD-10-CM | POA: Diagnosis not present

## 2021-10-01 DIAGNOSIS — M7989 Other specified soft tissue disorders: Secondary | ICD-10-CM | POA: Diagnosis not present

## 2021-10-14 ENCOUNTER — Other Ambulatory Visit: Payer: Self-pay | Admitting: Cardiology

## 2021-10-24 ENCOUNTER — Other Ambulatory Visit: Payer: Self-pay | Admitting: Cardiology

## 2021-11-10 DIAGNOSIS — I10 Essential (primary) hypertension: Secondary | ICD-10-CM | POA: Diagnosis not present

## 2021-11-10 DIAGNOSIS — K219 Gastro-esophageal reflux disease without esophagitis: Secondary | ICD-10-CM | POA: Diagnosis not present

## 2021-11-10 DIAGNOSIS — R7301 Impaired fasting glucose: Secondary | ICD-10-CM | POA: Diagnosis not present

## 2021-11-10 DIAGNOSIS — E7800 Pure hypercholesterolemia, unspecified: Secondary | ICD-10-CM | POA: Diagnosis not present

## 2021-11-10 DIAGNOSIS — I2510 Atherosclerotic heart disease of native coronary artery without angina pectoris: Secondary | ICD-10-CM | POA: Diagnosis not present

## 2021-11-10 DIAGNOSIS — E782 Mixed hyperlipidemia: Secondary | ICD-10-CM | POA: Diagnosis not present

## 2021-11-11 DIAGNOSIS — Z0000 Encounter for general adult medical examination without abnormal findings: Secondary | ICD-10-CM | POA: Diagnosis not present

## 2021-11-18 DIAGNOSIS — M25562 Pain in left knee: Secondary | ICD-10-CM | POA: Diagnosis not present

## 2021-11-22 DIAGNOSIS — H1132 Conjunctival hemorrhage, left eye: Secondary | ICD-10-CM | POA: Diagnosis not present

## 2021-12-01 DIAGNOSIS — M25562 Pain in left knee: Secondary | ICD-10-CM | POA: Diagnosis not present

## 2021-12-15 DIAGNOSIS — R262 Difficulty in walking, not elsewhere classified: Secondary | ICD-10-CM | POA: Diagnosis not present

## 2021-12-15 DIAGNOSIS — S83402D Sprain of unspecified collateral ligament of left knee, subsequent encounter: Secondary | ICD-10-CM | POA: Diagnosis not present

## 2021-12-15 DIAGNOSIS — M25562 Pain in left knee: Secondary | ICD-10-CM | POA: Diagnosis not present

## 2021-12-15 DIAGNOSIS — S83512D Sprain of anterior cruciate ligament of left knee, subsequent encounter: Secondary | ICD-10-CM | POA: Diagnosis not present

## 2021-12-24 ENCOUNTER — Other Ambulatory Visit: Payer: Self-pay | Admitting: Cardiology

## 2021-12-27 DIAGNOSIS — Z23 Encounter for immunization: Secondary | ICD-10-CM | POA: Diagnosis not present

## 2022-01-03 DIAGNOSIS — S83512D Sprain of anterior cruciate ligament of left knee, subsequent encounter: Secondary | ICD-10-CM | POA: Diagnosis not present

## 2022-01-03 DIAGNOSIS — S83402D Sprain of unspecified collateral ligament of left knee, subsequent encounter: Secondary | ICD-10-CM | POA: Diagnosis not present

## 2022-01-03 DIAGNOSIS — M25562 Pain in left knee: Secondary | ICD-10-CM | POA: Diagnosis not present

## 2022-01-04 ENCOUNTER — Other Ambulatory Visit: Payer: Self-pay | Admitting: Cardiology

## 2022-01-05 DIAGNOSIS — S83402D Sprain of unspecified collateral ligament of left knee, subsequent encounter: Secondary | ICD-10-CM | POA: Diagnosis not present

## 2022-01-05 DIAGNOSIS — R262 Difficulty in walking, not elsewhere classified: Secondary | ICD-10-CM | POA: Diagnosis not present

## 2022-01-05 DIAGNOSIS — S83512D Sprain of anterior cruciate ligament of left knee, subsequent encounter: Secondary | ICD-10-CM | POA: Diagnosis not present

## 2022-01-05 DIAGNOSIS — M25562 Pain in left knee: Secondary | ICD-10-CM | POA: Diagnosis not present

## 2022-01-17 DIAGNOSIS — S83512D Sprain of anterior cruciate ligament of left knee, subsequent encounter: Secondary | ICD-10-CM | POA: Diagnosis not present

## 2022-01-17 DIAGNOSIS — M25562 Pain in left knee: Secondary | ICD-10-CM | POA: Diagnosis not present

## 2022-01-17 DIAGNOSIS — S83402D Sprain of unspecified collateral ligament of left knee, subsequent encounter: Secondary | ICD-10-CM | POA: Diagnosis not present

## 2022-01-17 DIAGNOSIS — R262 Difficulty in walking, not elsewhere classified: Secondary | ICD-10-CM | POA: Diagnosis not present

## 2022-01-19 DIAGNOSIS — S83402D Sprain of unspecified collateral ligament of left knee, subsequent encounter: Secondary | ICD-10-CM | POA: Diagnosis not present

## 2022-01-19 DIAGNOSIS — R262 Difficulty in walking, not elsewhere classified: Secondary | ICD-10-CM | POA: Diagnosis not present

## 2022-01-19 DIAGNOSIS — M25562 Pain in left knee: Secondary | ICD-10-CM | POA: Diagnosis not present

## 2022-01-19 DIAGNOSIS — S83512D Sprain of anterior cruciate ligament of left knee, subsequent encounter: Secondary | ICD-10-CM | POA: Diagnosis not present

## 2022-01-25 DIAGNOSIS — M25562 Pain in left knee: Secondary | ICD-10-CM | POA: Diagnosis not present

## 2022-01-25 DIAGNOSIS — S83402D Sprain of unspecified collateral ligament of left knee, subsequent encounter: Secondary | ICD-10-CM | POA: Diagnosis not present

## 2022-01-25 DIAGNOSIS — R262 Difficulty in walking, not elsewhere classified: Secondary | ICD-10-CM | POA: Diagnosis not present

## 2022-01-25 DIAGNOSIS — S83512D Sprain of anterior cruciate ligament of left knee, subsequent encounter: Secondary | ICD-10-CM | POA: Diagnosis not present

## 2022-01-27 DIAGNOSIS — S83402D Sprain of unspecified collateral ligament of left knee, subsequent encounter: Secondary | ICD-10-CM | POA: Diagnosis not present

## 2022-01-27 DIAGNOSIS — R262 Difficulty in walking, not elsewhere classified: Secondary | ICD-10-CM | POA: Diagnosis not present

## 2022-01-27 DIAGNOSIS — S83512D Sprain of anterior cruciate ligament of left knee, subsequent encounter: Secondary | ICD-10-CM | POA: Diagnosis not present

## 2022-01-27 DIAGNOSIS — M25562 Pain in left knee: Secondary | ICD-10-CM | POA: Diagnosis not present

## 2022-02-21 DIAGNOSIS — R3911 Hesitancy of micturition: Secondary | ICD-10-CM | POA: Diagnosis not present

## 2022-03-02 DIAGNOSIS — Z1211 Encounter for screening for malignant neoplasm of colon: Secondary | ICD-10-CM | POA: Diagnosis not present

## 2022-03-02 DIAGNOSIS — K648 Other hemorrhoids: Secondary | ICD-10-CM | POA: Diagnosis not present

## 2022-03-02 DIAGNOSIS — Z800 Family history of malignant neoplasm of digestive organs: Secondary | ICD-10-CM | POA: Diagnosis not present

## 2022-03-02 DIAGNOSIS — Z83719 Family history of colon polyps, unspecified: Secondary | ICD-10-CM | POA: Diagnosis not present

## 2022-03-03 ENCOUNTER — Ambulatory Visit: Payer: Medicare Other | Attending: Cardiology | Admitting: Cardiology

## 2022-03-03 ENCOUNTER — Encounter: Payer: Self-pay | Admitting: Cardiology

## 2022-03-03 VITALS — BP 106/68 | HR 56 | Ht 70.0 in | Wt 211.8 lb

## 2022-03-03 DIAGNOSIS — I2510 Atherosclerotic heart disease of native coronary artery without angina pectoris: Secondary | ICD-10-CM | POA: Diagnosis not present

## 2022-03-03 DIAGNOSIS — I10 Essential (primary) hypertension: Secondary | ICD-10-CM

## 2022-03-03 DIAGNOSIS — R55 Syncope and collapse: Secondary | ICD-10-CM

## 2022-03-03 DIAGNOSIS — E7800 Pure hypercholesterolemia, unspecified: Secondary | ICD-10-CM

## 2022-03-03 MED ORDER — NITROGLYCERIN 0.4 MG SL SUBL: SUBLINGUAL_TABLET | SUBLINGUAL | 4 refills | Status: DC

## 2022-03-03 MED ORDER — ATORVASTATIN CALCIUM 80 MG PO TABS: 80.0000 mg | ORAL_TABLET | Freq: Every day | ORAL | 3 refills | Status: DC

## 2022-03-03 MED ORDER — LOSARTAN POTASSIUM 50 MG PO TABS: 50.0000 mg | ORAL_TABLET | Freq: Every day | ORAL | 3 refills | Status: DC

## 2022-03-03 NOTE — Addendum Note (Signed)
Addended by: Luellen Pucker on: 03/03/2022 01:20 PM   Modules accepted: Orders

## 2022-03-03 NOTE — Progress Notes (Signed)
Cardiology Office Note:    Date:  03/03/2022   ID:  Stephen Curtis, DOB [birth year], MRN [id number]  PCP:  Sigmund Hazel, MD  Cardiologist:  Armanda Magic, MD    Referring MD: Sigmund Hazel, MD   No chief complaint on file.   History of Present Illness:    Stephen Curtis is a 71 y.o. male with a hx of CAD and cath with subtotalled RCA with borderline 50-70% mid LAD s/p PCI of RCA 2014, dyslipidemia and vasovagal syncope  He is here today for followup and is doing well.  He denies any chest pain or pressure, SOB, DOE, PND, orthopnea, LE edema, dizziness, palpitations or syncope. He is compliant with his meds and is tolerating meds with no SE.    Past Medical History:  Diagnosis Date   BPH (benign prostatic hypertrophy)    CAD (coronary artery disease)    subtotalled RCA wth borderline 50-70% mid LAD s/p DES to RCA   Hyperlipidemia LDL goal <70    Retinopathy    Vasovagal syncope    Vertigo    hearing loss left ear evaluated by ENT Dr. Jearld Fenton    Past Surgical History:  Procedure Laterality Date   CARDIAC CATHETERIZATION     FLEXIBLE SIGMOIDOSCOPY     knee arthroscopic Left    LEFT HEART CATHETERIZATION WITH CORONARY ANGIOGRAM N/A 05/17/2012   Procedure: LEFT HEART CATHETERIZATION WITH CORONARY ANGIOGRAM;  Surgeon: Lesleigh Noe, MD;  Location: [facility] CATH LAB;  Service: Cardiovascular;  Laterality: N/A;    Current Medications: Current Meds  Medication Sig   aspirin EC 81 MG tablet Take 81 mg by mouth daily.   atorvastatin (LIPITOR) 80 MG tablet TAKE 1 TABLET BY MOUTH DAILY AT  6 PM   cetirizine (ZYRTEC) 10 MG tablet Take 10 mg by mouth daily.   Cholecalciferol (VITAMIN D) 125 MCG (5000 UT) CAPS    finasteride (PROSCAR) 5 MG tablet Take 5 mg by mouth daily.   losartan (COZAAR) 25 MG tablet TAKE 2 TABLETS BY MOUTH DAILY   MEGARED OMEGA-3 KRILL OIL 500 MG CAPS Take by mouth.   nitroGLYCERIN (NITROSTAT) 0.4 MG SL tablet Place 1 tablet under the tongue every 5 minutes as  needed for chest pain. Max 3 doses.   SILDENAFIL CITRATE PO Take by mouth as needed.   tamsulosin (FLOMAX) 0.4 MG CAPS capsule Take 0.4 mg by mouth daily.   Current Facility-Administered Medications for the 03/03/22 encounter (Office Visit) with Quintella Reichert, MD  Medication   triamcinolone acetonide (KENALOG) 10 MG/ML injection 10 mg     Allergies:   Patient has no active allergies.   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  ROS:   Please see the history of present illness.    ROS  All other systems reviewed and negative.   EKGs/Labs/Other Studies Reviewed:    The following studies were reviewed today: none  EKG:  EKG is  ordered today.  The ekg ordered today demonstrates sinus bradycardia at 56bpm with no ST changes  Recent Labs: No results found for requested labs within last 365 days.   Recent Lipid Panel    Component Value Date/Time   CHOL 119 11/12/2019 0736   CHOL 106 02/01/2013 0800   TRIG 53  11/12/2019 0736   TRIG 45 02/01/2013 0800   HDL 40 11/12/2019 0736   HDL 40 02/01/2013 0800   CHOLHDL 3.0 11/12/2019 0736   CHOLHDL 3.3 03/04/2016 0811   VLDL 13 03/04/2016 0811   LDLCALC 67 11/12/2019 0736   LDLCALC 57 02/01/2013 0800   LDLDIRECT 58.4 04/17/2013 0739    Physical Exam:    VS:  BP 106/68   Pulse (!) 56   Ht 5\' 10"  (1.778 m)   Wt 211 lb 12.8 oz (96.1 kg)   SpO2 98%   BMI 30.39 kg/m     Wt Readings from Last 3 Encounters:  03/03/22 211 lb 12.8 oz (96.1 kg)  11/18/20 216 lb (98 kg)  11/22/19 211 lb (95.7 kg)    GEN: Well nourished, well developed in no acute distress HEENT: Normal NECK: No JVD; No carotid bruits LYMPHATICS: No lymphadenopathy CARDIAC:RRR, no murmurs, rubs, gallops RESPIRATORY:  Clear to auscultation without rales, wheezing or rhonchi  ABDOMEN: Soft, non-tender, non-distended MUSCULOSKELETAL:  No edema; No deformity  SKIN: Warm and dry NEUROLOGIC:  Alert and oriented x 3 PSYCHIATRIC:  Normal affect  ASSESSMENT:    1. Coronary artery disease involving native coronary artery of native heart without angina pectoris   2. Pure hypercholesterolemia   3. Vasovagal syncope   4. Hypertension, unspecified type    PLAN:    In order of problems listed above:  1.  ASCAD  -cath with subtotalled RCA with borderline 50-70% mid LAD s/p PCI of RCA 2014.   -He denies any anginal sx since I last saw him -continue ASA 81mg  daily and Atorvastatin  2.  Hyperlipidemia  -LDL goal is < 70.  -I have personally reviewed and interpreted outside labs performed by patient's PCP which showed LDL 31 and HDL 59 on 11/10/2021 and normal ALT at 21 -Continue prescription drug management atorvastatin 80 mg daily with as needed refills  3.  Vasovagal syncope  -no further episodes of dizziness or syncope  4.  HTN -Blood pressure is well controlled on exam today -Continue prescription drug management with losartan 50 mg daily with as needed refills -I have  personally reviewed and interpreted outside performed by patient's PCP which showed serum creatinine 0.91 and potassium 4.2 on 11/10/2021  Medication Adjustments/Labs and Tests Ordered: Current medicines are reviewed at length with the patient today.  Concerns regarding medicines are outlined above.  Orders Placed This Encounter  Procedures   EKG 12-Lead    No orders of the defined types were placed in this encounter.    Signed, 11/12/2021, MD  03/03/2022 1:10 PM  [facility]

## 2022-03-03 NOTE — Patient Instructions (Signed)
Medication Instructions:  Your physician has recommended you make the following change in your medication: Stop taking losartan 25 mg twice daily. Start taking 50 mg losartan one time daily.  *If you need a refill on your cardiac medications before your next appointment, please call your pharmacy*   Lab Work: None. If you have labs (blood work) drawn today and your tests are completely normal, you will receive your results only by: MyChart Message (if you have MyChart) OR A paper copy in the mail If you have any lab test that is abnormal or we need to change your treatment, we will call you to review the results.   Testing/Procedures: None.   Follow-Up: At [facility], you and your health needs are our priority.  As part of our continuing mission to provide you with exceptional heart care, we have created designated Provider Care Teams.  These Care Teams include your primary Cardiologist (physician) and Advanced Practice Providers (APPs -  Physician Assistants and Nurse Practitioners) who all work together to provide you with the care you need, when you need it.  We recommend signing up for the patient portal called "MyChart".  Sign up information is provided on this After Visit Summary.  MyChart is used to connect with patients for Virtual Visits (Telemedicine).  Patients are able to view lab/test results, encounter notes, upcoming appointments, etc.  Non-urgent messages can be sent to your provider as well.   To learn more about what you can do with MyChart, go to [email or web address].    Your next appointment:   1 year(s)  The format for your next appointment:   In Person  Provider:   Armanda Magic, MD       Important Information About Sugar

## 2022-03-24 ENCOUNTER — Ambulatory Visit: Payer: Medicare Other | Admitting: Cardiology

## 2022-03-29 DIAGNOSIS — H53149 Visual discomfort, unspecified: Secondary | ICD-10-CM | POA: Diagnosis not present

## 2022-04-09 IMAGING — DX DG KNEE 3 VIEWS*R*
3 series · 3 of 3 positions shown · non-contrast
Comparison: None.

CLINICAL DATA: Recurrent right knee pain.

EXAM:
RIGHT KNEE - 3 VIEW

[dg knee 3 views right (1 of 3)]
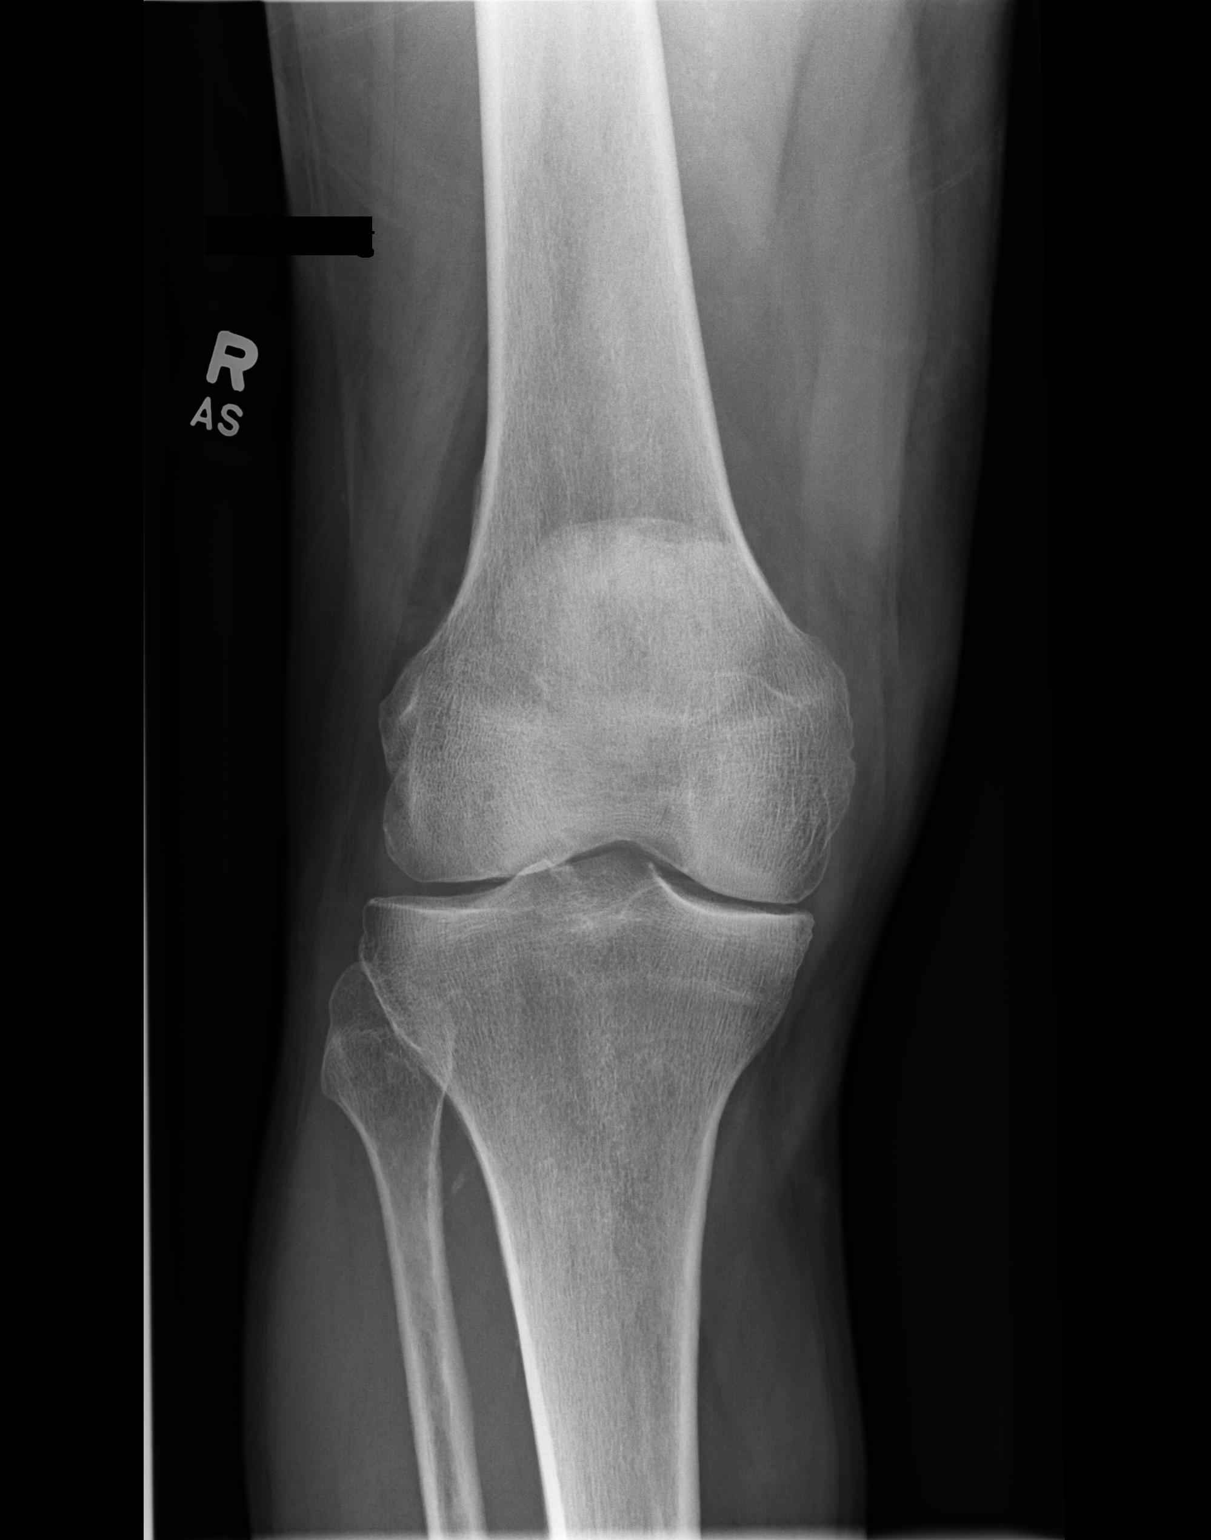

[dg knee 3 views right (2 of 3)]
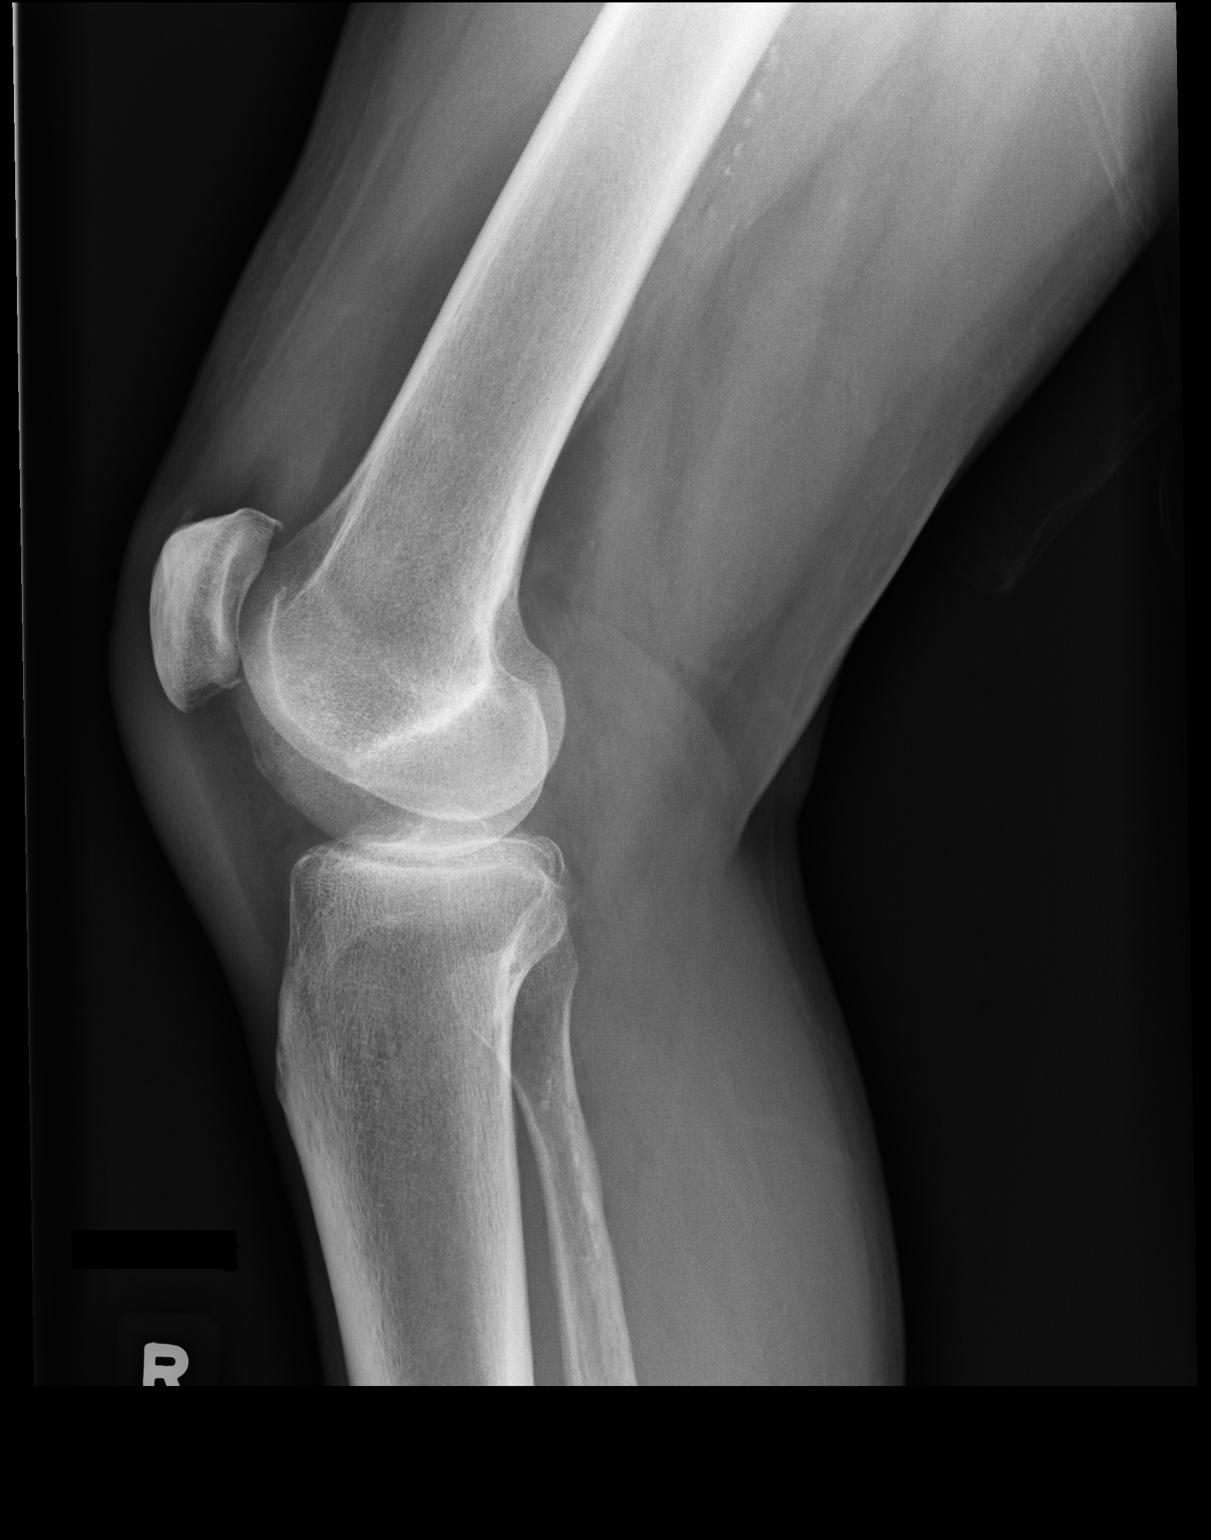

[dg knee 3 views right (3 of 3)]
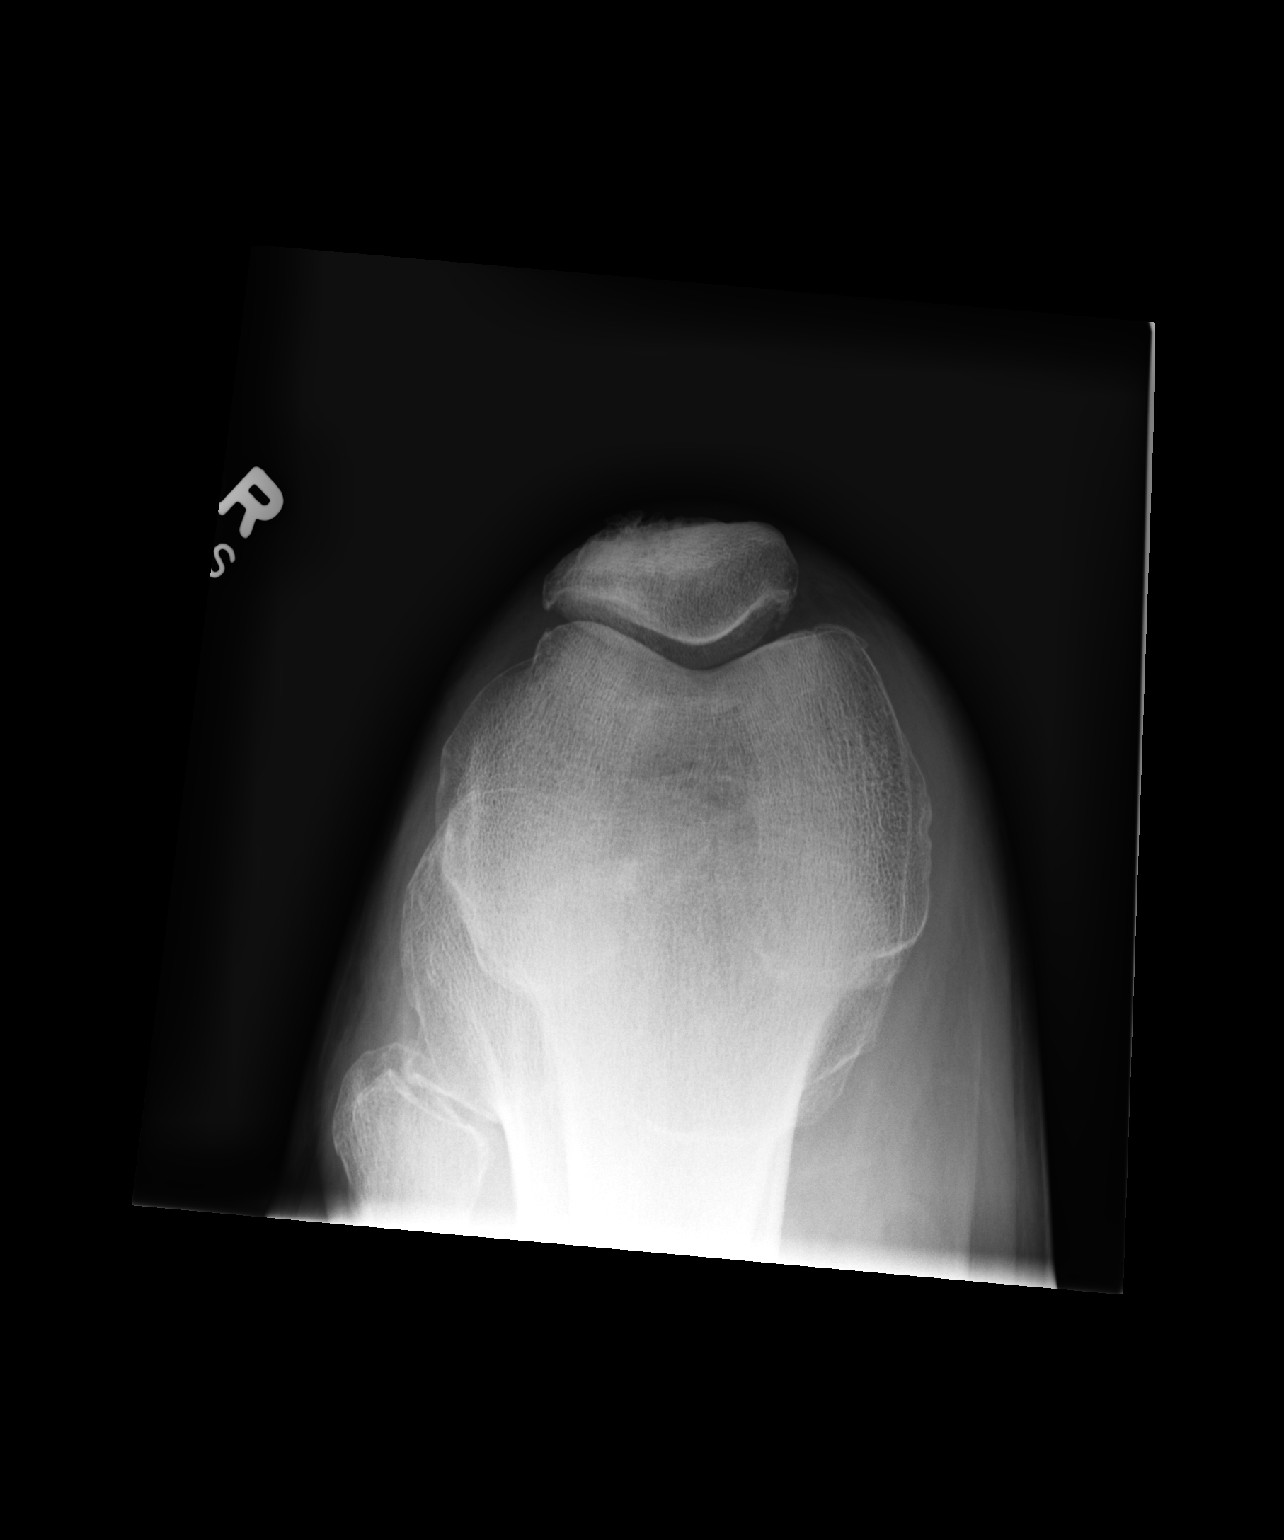

[3 of 3 positions shown; findings below may reference images not displayed]

FINDINGS: Medial tibiofemoral joint space narrowing. There is mild
tricompartmental peripheral spurring most prominently affecting the
patellofemoral compartment. Patella normally situated in the
trochlear groove. Small knee joint effusion. Trace quadriceps tendon
enthesophyte. No fracture, erosion, or focal bone abnormality. Mild
prepatellar soft tissue thickening.
IMPRESSION: 1. Mild tricompartmental osteoarthritis, most prominently affecting
the patellofemoral compartment. Small knee joint effusion.
2. Mild prepatellar soft tissue thickening may represent bursitis.

## 2022-05-11 DIAGNOSIS — M25562 Pain in left knee: Secondary | ICD-10-CM | POA: Diagnosis not present

## 2022-05-12 ENCOUNTER — Other Ambulatory Visit: Payer: Self-pay | Admitting: Sports Medicine

## 2022-05-12 DIAGNOSIS — M25562 Pain in left knee: Secondary | ICD-10-CM

## 2022-05-13 ENCOUNTER — Ambulatory Visit
Admission: RE | Admit: 2022-05-13 | Discharge: 2022-05-13 | Disposition: A | Discharge status: Home / Self Care | Payer: Medicare Other | Source: Ambulatory Visit | Attending: Sports Medicine | Admitting: Sports Medicine

## 2022-05-13 DIAGNOSIS — M25562 Pain in left knee: Secondary | ICD-10-CM | POA: Diagnosis not present

## 2022-05-24 DIAGNOSIS — S83282A Other tear of lateral meniscus, current injury, left knee, initial encounter: Secondary | ICD-10-CM | POA: Diagnosis not present

## 2022-05-30 DIAGNOSIS — M2242 Chondromalacia patellae, left knee: Secondary | ICD-10-CM | POA: Diagnosis not present

## 2022-05-30 DIAGNOSIS — S83232A Complex tear of medial meniscus, current injury, left knee, initial encounter: Secondary | ICD-10-CM | POA: Diagnosis not present

## 2022-05-30 DIAGNOSIS — G8918 Other acute postprocedural pain: Secondary | ICD-10-CM | POA: Diagnosis not present

## 2022-05-30 DIAGNOSIS — M659 Synovitis and tenosynovitis, unspecified: Secondary | ICD-10-CM | POA: Diagnosis not present

## 2022-05-30 DIAGNOSIS — S83252A Bucket-handle tear of lateral meniscus, current injury, left knee, initial encounter: Secondary | ICD-10-CM | POA: Diagnosis not present

## 2022-06-15 DIAGNOSIS — Z4789 Encounter for other orthopedic aftercare: Secondary | ICD-10-CM | POA: Diagnosis not present

## 2022-06-21 DIAGNOSIS — S83242D Other tear of medial meniscus, current injury, left knee, subsequent encounter: Secondary | ICD-10-CM | POA: Diagnosis not present

## 2022-06-21 DIAGNOSIS — M25662 Stiffness of left knee, not elsewhere classified: Secondary | ICD-10-CM | POA: Diagnosis not present

## 2022-06-21 DIAGNOSIS — M6281 Muscle weakness (generalized): Secondary | ICD-10-CM | POA: Diagnosis not present

## 2022-06-21 DIAGNOSIS — S83282D Other tear of lateral meniscus, current injury, left knee, subsequent encounter: Secondary | ICD-10-CM | POA: Diagnosis not present

## 2022-06-27 DIAGNOSIS — M6281 Muscle weakness (generalized): Secondary | ICD-10-CM | POA: Diagnosis not present

## 2022-06-27 DIAGNOSIS — S83282D Other tear of lateral meniscus, current injury, left knee, subsequent encounter: Secondary | ICD-10-CM | POA: Diagnosis not present

## 2022-06-27 DIAGNOSIS — M25662 Stiffness of left knee, not elsewhere classified: Secondary | ICD-10-CM | POA: Diagnosis not present

## 2022-06-27 DIAGNOSIS — S83242D Other tear of medial meniscus, current injury, left knee, subsequent encounter: Secondary | ICD-10-CM | POA: Diagnosis not present

## 2022-06-30 DIAGNOSIS — S83282D Other tear of lateral meniscus, current injury, left knee, subsequent encounter: Secondary | ICD-10-CM | POA: Diagnosis not present

## 2022-06-30 DIAGNOSIS — S83242D Other tear of medial meniscus, current injury, left knee, subsequent encounter: Secondary | ICD-10-CM | POA: Diagnosis not present

## 2022-06-30 DIAGNOSIS — M6281 Muscle weakness (generalized): Secondary | ICD-10-CM | POA: Diagnosis not present

## 2022-06-30 DIAGNOSIS — M25662 Stiffness of left knee, not elsewhere classified: Secondary | ICD-10-CM | POA: Diagnosis not present

## 2022-07-05 DIAGNOSIS — M6281 Muscle weakness (generalized): Secondary | ICD-10-CM | POA: Diagnosis not present

## 2022-07-05 DIAGNOSIS — M25662 Stiffness of left knee, not elsewhere classified: Secondary | ICD-10-CM | POA: Diagnosis not present

## 2022-07-05 DIAGNOSIS — S83242D Other tear of medial meniscus, current injury, left knee, subsequent encounter: Secondary | ICD-10-CM | POA: Diagnosis not present

## 2022-07-05 DIAGNOSIS — S83282D Other tear of lateral meniscus, current injury, left knee, subsequent encounter: Secondary | ICD-10-CM | POA: Diagnosis not present

## 2022-07-07 DIAGNOSIS — M6281 Muscle weakness (generalized): Secondary | ICD-10-CM | POA: Diagnosis not present

## 2022-07-07 DIAGNOSIS — S83242D Other tear of medial meniscus, current injury, left knee, subsequent encounter: Secondary | ICD-10-CM | POA: Diagnosis not present

## 2022-07-07 DIAGNOSIS — S83282D Other tear of lateral meniscus, current injury, left knee, subsequent encounter: Secondary | ICD-10-CM | POA: Diagnosis not present

## 2022-07-07 DIAGNOSIS — M25662 Stiffness of left knee, not elsewhere classified: Secondary | ICD-10-CM | POA: Diagnosis not present

## 2022-07-11 DIAGNOSIS — S83282D Other tear of lateral meniscus, current injury, left knee, subsequent encounter: Secondary | ICD-10-CM | POA: Diagnosis not present

## 2022-07-11 DIAGNOSIS — M6281 Muscle weakness (generalized): Secondary | ICD-10-CM | POA: Diagnosis not present

## 2022-07-11 DIAGNOSIS — S83242D Other tear of medial meniscus, current injury, left knee, subsequent encounter: Secondary | ICD-10-CM | POA: Diagnosis not present

## 2022-07-11 DIAGNOSIS — M25662 Stiffness of left knee, not elsewhere classified: Secondary | ICD-10-CM | POA: Diagnosis not present

## 2022-07-13 DIAGNOSIS — S83282D Other tear of lateral meniscus, current injury, left knee, subsequent encounter: Secondary | ICD-10-CM | POA: Diagnosis not present

## 2022-07-13 DIAGNOSIS — M25662 Stiffness of left knee, not elsewhere classified: Secondary | ICD-10-CM | POA: Diagnosis not present

## 2022-07-13 DIAGNOSIS — M6281 Muscle weakness (generalized): Secondary | ICD-10-CM | POA: Diagnosis not present

## 2022-07-13 DIAGNOSIS — S83242D Other tear of medial meniscus, current injury, left knee, subsequent encounter: Secondary | ICD-10-CM | POA: Diagnosis not present

## 2022-07-18 DIAGNOSIS — M25662 Stiffness of left knee, not elsewhere classified: Secondary | ICD-10-CM | POA: Diagnosis not present

## 2022-07-18 DIAGNOSIS — S83282D Other tear of lateral meniscus, current injury, left knee, subsequent encounter: Secondary | ICD-10-CM | POA: Diagnosis not present

## 2022-07-18 DIAGNOSIS — S83242D Other tear of medial meniscus, current injury, left knee, subsequent encounter: Secondary | ICD-10-CM | POA: Diagnosis not present

## 2022-07-18 DIAGNOSIS — M6281 Muscle weakness (generalized): Secondary | ICD-10-CM | POA: Diagnosis not present

## 2022-07-21 DIAGNOSIS — M545 Low back pain, unspecified: Secondary | ICD-10-CM | POA: Diagnosis not present

## 2022-07-26 DIAGNOSIS — S83242D Other tear of medial meniscus, current injury, left knee, subsequent encounter: Secondary | ICD-10-CM | POA: Diagnosis not present

## 2022-07-26 DIAGNOSIS — S83282D Other tear of lateral meniscus, current injury, left knee, subsequent encounter: Secondary | ICD-10-CM | POA: Diagnosis not present

## 2022-07-26 DIAGNOSIS — M6281 Muscle weakness (generalized): Secondary | ICD-10-CM | POA: Diagnosis not present

## 2022-07-26 DIAGNOSIS — M25662 Stiffness of left knee, not elsewhere classified: Secondary | ICD-10-CM | POA: Diagnosis not present

## 2022-08-17 DIAGNOSIS — S39012D Strain of muscle, fascia and tendon of lower back, subsequent encounter: Secondary | ICD-10-CM | POA: Diagnosis not present

## 2022-08-24 DIAGNOSIS — S39012D Strain of muscle, fascia and tendon of lower back, subsequent encounter: Secondary | ICD-10-CM | POA: Diagnosis not present

## 2022-08-24 DIAGNOSIS — H903 Sensorineural hearing loss, bilateral: Secondary | ICD-10-CM | POA: Diagnosis not present

## 2022-08-26 DIAGNOSIS — S39012D Strain of muscle, fascia and tendon of lower back, subsequent encounter: Secondary | ICD-10-CM | POA: Diagnosis not present

## 2022-08-29 DIAGNOSIS — S39012D Strain of muscle, fascia and tendon of lower back, subsequent encounter: Secondary | ICD-10-CM | POA: Diagnosis not present

## 2022-09-01 DIAGNOSIS — S39012D Strain of muscle, fascia and tendon of lower back, subsequent encounter: Secondary | ICD-10-CM | POA: Diagnosis not present

## 2022-09-14 DIAGNOSIS — S39012D Strain of muscle, fascia and tendon of lower back, subsequent encounter: Secondary | ICD-10-CM | POA: Diagnosis not present

## 2022-09-16 DIAGNOSIS — S39012D Strain of muscle, fascia and tendon of lower back, subsequent encounter: Secondary | ICD-10-CM | POA: Diagnosis not present

## 2022-09-21 DIAGNOSIS — S39012D Strain of muscle, fascia and tendon of lower back, subsequent encounter: Secondary | ICD-10-CM | POA: Diagnosis not present

## 2022-09-30 DIAGNOSIS — Z961 Presence of intraocular lens: Secondary | ICD-10-CM | POA: Diagnosis not present

## 2022-09-30 DIAGNOSIS — H43813 Vitreous degeneration, bilateral: Secondary | ICD-10-CM | POA: Diagnosis not present

## 2022-10-03 DIAGNOSIS — U071 COVID-19: Secondary | ICD-10-CM | POA: Diagnosis not present

## 2022-11-10 ENCOUNTER — Other Ambulatory Visit: Payer: Self-pay | Admitting: Cardiology

## 2022-11-23 DIAGNOSIS — Z23 Encounter for immunization: Secondary | ICD-10-CM | POA: Diagnosis not present

## 2022-11-23 DIAGNOSIS — Z0000 Encounter for general adult medical examination without abnormal findings: Secondary | ICD-10-CM | POA: Diagnosis not present

## 2022-11-23 DIAGNOSIS — R7303 Prediabetes: Secondary | ICD-10-CM | POA: Diagnosis not present

## 2022-11-23 DIAGNOSIS — I10 Essential (primary) hypertension: Secondary | ICD-10-CM | POA: Diagnosis not present

## 2022-11-23 DIAGNOSIS — I2510 Atherosclerotic heart disease of native coronary artery without angina pectoris: Secondary | ICD-10-CM | POA: Diagnosis not present

## 2022-11-23 DIAGNOSIS — E782 Mixed hyperlipidemia: Secondary | ICD-10-CM | POA: Diagnosis not present

## 2022-11-25 DIAGNOSIS — H608X3 Other otitis externa, bilateral: Secondary | ICD-10-CM | POA: Diagnosis not present

## 2022-12-01 ENCOUNTER — Ambulatory Visit: Payer: Medicare Other | Admitting: Podiatry

## 2022-12-01 ENCOUNTER — Encounter: Payer: Self-pay | Admitting: Podiatry

## 2022-12-01 ENCOUNTER — Ambulatory Visit (INDEPENDENT_AMBULATORY_CARE_PROVIDER_SITE_OTHER): Payer: Medicare Other

## 2022-12-01 VITALS — BP 139/79 | HR 62

## 2022-12-01 DIAGNOSIS — M7751 Other enthesopathy of right foot: Secondary | ICD-10-CM | POA: Diagnosis not present

## 2022-12-01 DIAGNOSIS — M898X9 Other specified disorders of bone, unspecified site: Secondary | ICD-10-CM

## 2022-12-01 MED ORDER — TRIAMCINOLONE ACETONIDE 10 MG/ML IJ SUSP
10.0000 mg | Freq: Once | INTRAMUSCULAR | Status: AC
Start: 2022-12-01 — End: 2022-12-01
  Administered 2022-12-01: 10 mg via INTRA_ARTICULAR

## 2022-12-01 NOTE — Progress Notes (Signed)
Subjective:   Patient ID: Stephen Curtis, male   DOB: 72 y.o.   MRN: [id number]   HPI Patient states developed a lot of pain again around the big toe joint right concerned about arthritis stated it did well for a good period of time   ROS      Objective:  Physical Exam  Neurovascular status intact with inflammation first MPJ right fluid buildup around the joint with pain     Assessment:  Inflammatory capsulitis first MPJ right with arthritic changes     Plan:  H&P reviewed sterile prep injected around the first MPJ 3 mg Kenalog 5 mg Xylocaine and advised on rigid bottom shoes see back as needed  X-rays do not indicate significant advancement of hallux limitus deformity from previous x-rays 2 years ago

## 2023-01-05 ENCOUNTER — Other Ambulatory Visit: Payer: Self-pay | Admitting: Cardiology

## 2023-01-19 ENCOUNTER — Other Ambulatory Visit: Payer: Self-pay | Admitting: Cardiology

## 2023-02-02 DIAGNOSIS — M545 Low back pain, unspecified: Secondary | ICD-10-CM | POA: Diagnosis not present

## 2023-02-05 ENCOUNTER — Other Ambulatory Visit: Payer: Self-pay | Admitting: Cardiology

## 2023-02-06 DIAGNOSIS — M6281 Muscle weakness (generalized): Secondary | ICD-10-CM | POA: Diagnosis not present

## 2023-02-06 DIAGNOSIS — M545 Low back pain, unspecified: Secondary | ICD-10-CM | POA: Diagnosis not present

## 2023-02-06 DIAGNOSIS — S39012D Strain of muscle, fascia and tendon of lower back, subsequent encounter: Secondary | ICD-10-CM | POA: Diagnosis not present

## 2023-02-17 DIAGNOSIS — S39012D Strain of muscle, fascia and tendon of lower back, subsequent encounter: Secondary | ICD-10-CM | POA: Diagnosis not present

## 2023-02-17 DIAGNOSIS — M545 Low back pain, unspecified: Secondary | ICD-10-CM | POA: Diagnosis not present

## 2023-02-17 DIAGNOSIS — M6281 Muscle weakness (generalized): Secondary | ICD-10-CM | POA: Diagnosis not present

## 2023-02-20 DIAGNOSIS — R3911 Hesitancy of micturition: Secondary | ICD-10-CM | POA: Diagnosis not present

## 2023-02-23 DIAGNOSIS — M545 Low back pain, unspecified: Secondary | ICD-10-CM | POA: Diagnosis not present

## 2023-02-23 DIAGNOSIS — M6281 Muscle weakness (generalized): Secondary | ICD-10-CM | POA: Diagnosis not present

## 2023-02-23 DIAGNOSIS — S39012D Strain of muscle, fascia and tendon of lower back, subsequent encounter: Secondary | ICD-10-CM | POA: Diagnosis not present

## 2023-03-02 DIAGNOSIS — S39012D Strain of muscle, fascia and tendon of lower back, subsequent encounter: Secondary | ICD-10-CM | POA: Diagnosis not present

## 2023-03-02 DIAGNOSIS — M6281 Muscle weakness (generalized): Secondary | ICD-10-CM | POA: Diagnosis not present

## 2023-03-02 DIAGNOSIS — M545 Low back pain, unspecified: Secondary | ICD-10-CM | POA: Diagnosis not present

## 2023-03-18 ENCOUNTER — Other Ambulatory Visit: Payer: Self-pay | Admitting: Cardiology

## 2023-04-02 ENCOUNTER — Other Ambulatory Visit: Payer: Self-pay | Admitting: Cardiology

## 2023-04-09 NOTE — Progress Notes (Unsigned)
Cardiology Office Note    Patient Name: Stephen Curtis Date of Encounter: 04/09/2023  Primary Care Provider:  Sigmund Hazel, MD Primary Cardiologist:  Armanda Magic, MD Primary Electrophysiologist: None   Past Medical History    Past Medical History:  Diagnosis Date   BPH (benign prostatic hypertrophy)    CAD (coronary artery disease)    subtotalled RCA wth borderline 50-70% mid LAD s/p DES to RCA   Hyperlipidemia LDL goal <70    Retinopathy    Vasovagal syncope    Vertigo    hearing loss left ear evaluated by ENT Dr. Jearld Fenton    History of Present Illness  Stephen Curtis is a 73 y.o. male with a PMH of CAD s/p LHC 2014 50 to 70% mid LAD and subtotal occlusion of RCA treated with DES x 2 HLD, HTN, vasovagal syncope, BPH who presents today for annual follow-up.  Stephen Curtis was seen initially in 2014 for complaint of chest pain and underwent ETT with shortness of breath during procedure along with drop in systolic BP.  He underwent urgent diagnostic LHC that showed subtotal occlusion of RCA treated with DES x 2 with 50 to 70% mid LAD occlusion treated medically.  He completed cardiac rehab and developed syncope while using the treadmill and was evaluated in the ED and was noted to be hypotensive and bradycardic.  He underwent a 2D echo that showed normal EF and completed nuclear stress test that was normal with no evidence of new ischemia.  He was last seen by Dr. Mayford Knife on 03/03/2022 for follow-up.  During visit he was doing well with no new cardiac complaints.  He was tolerating his current medication regimen with no adverse reactions.  Stephen Curtis presents today for overdue follow-up. Since the last visit in December 2023, the patient reported no new cardiac complaints and continued adherence to prescribed medications. The patient remained active, engaging in regular walking, occasional gym visits, and yard work. He reported a persistent issue of dizziness, particularly when lying  on the left side, which resulted in a sensation of the room spinning. However, there were no episodes of syncope. The patient denied any cardiac symptoms during physical activities, such as chest tightness, pressure, or skipped beats.  The patient's cholesterol levels were well-managed with Lipitor, with a recent lab showing a total cholesterol level of less than 200 and LDL of 67. He underwent arthroscopic surgery on the left knee at an outside facility since the last visit, followed by successful rehabilitation. The patient reported no new complaints or concerns since the last visit.  Review of Systems  Please see the history of present illness.    All other systems reviewed and are otherwise negative except as noted above.  Physical Exam    Wt Readings from Last 3 Encounters:  03/03/22 211 lb 12.8 oz (96.1 kg)  11/18/20 216 lb (98 kg)  11/22/19 211 lb (95.7 kg)   RU:EAVWU were no vitals filed for this visit.,There is no height or weight on file to calculate BMI. GEN: Well nourished, well developed in no acute distress Neck: No JVD; No carotid bruits Pulmonary: Clear to auscultation without rales, wheezing or rhonchi  Cardiovascular: Normal rate. Regular rhythm. Normal S1. Normal S2.   Murmurs: There is no murmur.  ABDOMEN: Soft, non-tender, non-distended EXTREMITIES:  No edema; No deformity   EKG/LABS/ Recent Cardiac Studies   ECG personally reviewed by me today -sinus bradycardia with rate of 57 bpm no acute changes consistent with previous  EKG.  Risk Assessment/Calculations:          Lab Results  Component Value Date   WBC 8.1 06/18/2012   HGB 15.0 06/18/2012   HCT 40.7 06/18/2012   MCV 84.1 06/18/2012   PLT 235 06/18/2012   Lab Results  Component Value Date   CREATININE 1.01 12/06/2019   BUN 15 12/06/2019   NA 142 12/06/2019   K 4.0 12/06/2019   CL 105 12/06/2019   CO2 25 12/06/2019   Lab Results  Component Value Date   CHOL 119 11/12/2019   HDL 40 11/12/2019    LDLCALC 67 11/12/2019   LDLDIRECT 58.4 04/17/2013   TRIG 53 11/12/2019   CHOLHDL 3.0 11/12/2019    Lab Results  Component Value Date   HGBA1C 5.8 (H) 05/17/2012   Assessment & Plan    1.  Coronary artery disease: -s/p diagnostic LHC with PCI/DES x 2 to subtotal occluded RCA and 50-70% mid LAD occlusion treated medically -Today patient reports no chest pain or angina since previous follow-up. -Continue current GDMT with ASA 81 mg, Lipitor 80 mg, Nitrostat 0.4 mg -Continue low-sodium heart healthy diet and continue current physical activity  2.  Essential hypertension: -Patient's blood pressure today is well-controlled at 122/78 -Continue losartan 50 mg daily  3.  Hyperlipidemia: -Patient's LDL cholesterol is controlled at 67 -Continue Lipitor 80 mg daily  4.  History of vasovagal syncope: -Patient reports some vertigo symptoms when lying on left side in bed -Continue current treatment plan per PCP  5.General Health Maintenance: -Refill Nitroglycerin prescription. -Plan for annual follow-up visit.  Disposition: Follow-up with Armanda Magic, MD or APP in 12 months   Signed, Napoleon Form, Leodis Rains, NP 04/09/2023, 1:27 PM  [facility]

## 2023-04-10 ENCOUNTER — Encounter: Payer: Self-pay | Admitting: Nurse Practitioner

## 2023-04-10 ENCOUNTER — Ambulatory Visit: Payer: Medicare Other | Attending: Nurse Practitioner | Admitting: Nurse Practitioner

## 2023-04-10 ENCOUNTER — Encounter (HOSPITAL_BASED_OUTPATIENT_CLINIC_OR_DEPARTMENT_OTHER): Payer: Self-pay | Admitting: Cardiology

## 2023-04-10 VITALS — BP 122/78 | HR 57 | Ht 70.0 in | Wt 210.6 lb

## 2023-04-10 DIAGNOSIS — Z0000 Encounter for general adult medical examination without abnormal findings: Secondary | ICD-10-CM

## 2023-04-10 DIAGNOSIS — E7800 Pure hypercholesterolemia, unspecified: Secondary | ICD-10-CM

## 2023-04-10 DIAGNOSIS — I2510 Atherosclerotic heart disease of native coronary artery without angina pectoris: Secondary | ICD-10-CM | POA: Diagnosis not present

## 2023-04-10 DIAGNOSIS — R55 Syncope and collapse: Secondary | ICD-10-CM

## 2023-04-10 DIAGNOSIS — I10 Essential (primary) hypertension: Secondary | ICD-10-CM

## 2023-04-10 MED ORDER — NITROGLYCERIN 0.4 MG SL SUBL: SUBLINGUAL_TABLET | SUBLINGUAL | 3 refills | Status: AC

## 2023-04-10 NOTE — Patient Instructions (Signed)
Medication Instructions:    Your physician recommends that you continue on your current medications as directed. Please refer to the Current Medication list given to you today.   *If you need a refill on your cardiac medications before your next appointment, please call your pharmacy*   Lab Work: NONE ORDERED  TODAY    If you have labs (blood work) drawn today and your tests are completely normal, you will receive your results only by: MyChart Message (if you have MyChart) OR A paper copy in the mail If you have any lab test that is abnormal or we need to change your treatment, we will call you to review the results.   Testing/Procedures:  NONE ORDERED  TODAY      Follow-Up: At [facility], you and your health needs are our priority.  As part of our continuing mission to provide you with exceptional heart care, we have created designated Provider Care Teams.  These Care Teams include your primary Cardiologist (physician) and Advanced Practice Providers (APPs -  Physician Assistants and Nurse Practitioners) who all work together to provide you with the care you need, when you need it.  We recommend signing up for the patient portal called "MyChart".  Sign up information is provided on this After Visit Summary.  MyChart is used to connect with patients for Virtual Visits (Telemedicine).  Patients are able to view lab/test results, encounter notes, upcoming appointments, etc.  Non-urgent messages can be sent to your provider as well.   To learn more about what you can do with MyChart, go to [email or web address].    Your next appointment:   1 year(s)  Provider:   Armanda Magic, MD     Other Instructions

## 2023-04-13 ENCOUNTER — Other Ambulatory Visit: Payer: Self-pay | Admitting: Cardiology

## 2023-07-15 ENCOUNTER — Other Ambulatory Visit: Payer: Self-pay | Admitting: Cardiology

## 2023-08-15 DIAGNOSIS — M545 Low back pain, unspecified: Secondary | ICD-10-CM | POA: Diagnosis not present

## 2023-09-11 DIAGNOSIS — M545 Low back pain, unspecified: Secondary | ICD-10-CM | POA: Diagnosis not present

## 2023-09-19 DIAGNOSIS — M545 Low back pain, unspecified: Secondary | ICD-10-CM | POA: Diagnosis not present

## 2023-10-09 DIAGNOSIS — H43813 Vitreous degeneration, bilateral: Secondary | ICD-10-CM | POA: Diagnosis not present

## 2023-10-09 DIAGNOSIS — Z961 Presence of intraocular lens: Secondary | ICD-10-CM | POA: Diagnosis not present

## 2023-10-18 DIAGNOSIS — M47816 Spondylosis without myelopathy or radiculopathy, lumbar region: Secondary | ICD-10-CM | POA: Diagnosis not present

## 2023-10-31 ENCOUNTER — Encounter: Payer: Self-pay | Admitting: Cardiology

## 2023-11-01 ENCOUNTER — Emergency Department (HOSPITAL_COMMUNITY)

## 2023-11-01 ENCOUNTER — Other Ambulatory Visit: Payer: Self-pay

## 2023-11-01 ENCOUNTER — Emergency Department (HOSPITAL_COMMUNITY)
Admission: EM | Admit: 2023-11-01 | Discharge: 2023-11-01 | Disposition: A | Discharge status: Home / Self Care | Attending: Student | Admitting: Student

## 2023-11-01 ENCOUNTER — Encounter (HOSPITAL_COMMUNITY): Payer: Self-pay

## 2023-11-01 DIAGNOSIS — Z79899 Other long term (current) drug therapy: Secondary | ICD-10-CM | POA: Diagnosis not present

## 2023-11-01 DIAGNOSIS — J9811 Atelectasis: Secondary | ICD-10-CM | POA: Diagnosis not present

## 2023-11-01 DIAGNOSIS — R079 Chest pain, unspecified: Secondary | ICD-10-CM

## 2023-11-01 DIAGNOSIS — R0789 Other chest pain: Secondary | ICD-10-CM | POA: Insufficient documentation

## 2023-11-01 DIAGNOSIS — Z7982 Long term (current) use of aspirin: Secondary | ICD-10-CM | POA: Insufficient documentation

## 2023-11-01 DIAGNOSIS — R112 Nausea with vomiting, unspecified: Secondary | ICD-10-CM | POA: Insufficient documentation

## 2023-11-01 DIAGNOSIS — R059 Cough, unspecified: Secondary | ICD-10-CM | POA: Diagnosis not present

## 2023-11-01 DIAGNOSIS — I2510 Atherosclerotic heart disease of native coronary artery without angina pectoris: Secondary | ICD-10-CM | POA: Diagnosis not present

## 2023-11-01 DIAGNOSIS — R067 Sneezing: Secondary | ICD-10-CM | POA: Diagnosis not present

## 2023-11-01 LAB — CBC
HCT: 44.6 % (ref 39.0–52.0)
Hemoglobin: 14.8 g/dL (ref 13.0–17.0)
MCH: 30.3 pg (ref 26.0–34.0)
MCHC: 33.2 g/dL (ref 30.0–36.0)
MCV: 91.4 fL (ref 80.0–100.0)
Platelets: 373 K/uL (ref 150–400)
RBC: 4.88 MIL/uL (ref 4.22–5.81)
RDW: 13 % (ref 11.5–15.5)
WBC: 4.1 K/uL (ref 4.0–10.5)
nRBC: 0 % (ref 0.0–0.2)

## 2023-11-01 LAB — TROPONIN I (HIGH SENSITIVITY)
Troponin I (High Sensitivity): 4 ng/L (ref <18)
Troponin I (High Sensitivity): 4 ng/L (ref <18)

## 2023-11-01 LAB — COMPREHENSIVE METABOLIC PANEL WITH GFR
ALT: 27 U/L (ref 0–44)
AST: 23 U/L (ref 15–41)
Albumin: 4.3 g/dL (ref 3.5–5.0)
Alkaline Phosphatase: 46 U/L (ref 38–126)
Anion gap: 12 (ref 5–15)
BUN: 11 mg/dL (ref 8–23)
CO2: 27 mmol/L (ref 22–32)
Calcium: 9.8 mg/dL (ref 8.9–10.3)
Chloride: 102 mmol/L (ref 98–111)
Creatinine, Ser: 1.09 mg/dL (ref 0.61–1.24)
GFR, Estimated: >60 mL/min (ref >60)
Glucose, Bld: 158 mg/dL — ABNORMAL HIGH (ref 70–99)
Potassium: 3.6 mmol/L (ref 3.5–5.1)
Sodium: 141 mmol/L (ref 135–145)
Total Bilirubin: 0.7 mg/dL (ref 0.0–1.2)
Total Protein: 6.8 g/dL (ref 6.5–8.1)

## 2023-11-01 LAB — LIPASE, BLOOD: Lipase: 28 U/L (ref 11–51)

## 2023-11-01 LAB — MAGNESIUM: Magnesium: 2 mg/dL (ref 1.7–2.4)

## 2023-11-01 NOTE — ED Triage Notes (Signed)
 Pt states he recently ate expired chicken and thought he had food poisoning. Pt states he was throwing up so much he blacked out. Pt state he is having chest tightness from throwing up. Denies SHOB.

## 2023-11-01 NOTE — Discharge Instructions (Signed)
 As we discussed, this is likely from the large amounts of vomiting that you have.  This is likely her intercostal muscles that are causing you pain when you sneeze and cough.  This should get better with time.  You can take Tylenol /ibuprofen for pain.  Please follow-up with your primary care doctor and/or cardiologist.  You may return to the emergency department for any worsening symptoms.

## 2023-11-01 NOTE — ED Provider Notes (Signed)
 [place] EMERGENCY DEPARTMENT AT [facility] Provider Note   CSN: [id number] Arrival date & time: 11/01/23  1230     Patient presents with: Chest Pain   Stephen Curtis is a 73 y.o. male patient with history of hyperlipidemia, coronary artery disease who presents to the emergency department today for further evaluation of diffuse anterior chest pain has been present for the last 1 to 2 days.  Patient states that he ate some bad chicken on Monday and over the last 2 days has had some intractable nausea and vomiting.  Patient has not had any vomiting today and does note some abdominal soreness primarily when he coughs.  Patient states that his chest pain is worse with sneezing and coughing.  He denies any shortness of breath, fever, chills.    Chest Pain      Prior to Admission medications   Medication Sig Start Date End Date Taking? Authorizing Provider  aspirin  EC 81 MG tablet Take 81 mg by mouth daily.    [provider]  atorvastatin  (LIPITOR ) 80 MG tablet TAKE 1 TABLET BY MOUTH ONCE  DAILY 04/14/23   Dick, Ernest H Jr., NP  cetirizine (ZYRTEC) 10 MG tablet Take 10 mg by mouth daily.    [provider]  Cholecalciferol (VITAMIN D) 125 MCG (5000 UT) CAPS  09/26/19   [provider]  finasteride (PROSCAR) 5 MG tablet Take 5 mg by mouth daily.    [provider]  losartan  (COZAAR ) 50 MG tablet TAKE 1 TABLET BY MOUTH DAILY 07/18/23   Turner, Wilbert SAUNDERS, MD  MEGARED OMEGA-3 KRILL OIL 500 MG CAPS Take by mouth.    [provider]  nitroGLYCERIN  (NITROSTAT ) 0.4 MG SL tablet DISSOLVE 1 TABLET UNDER THE  TONGUE EVERY 5 MINUTES AS NEEDED FOR CHEST PAIN. MAX OF 3 TABLETS IN 15 MINUTES. CALL 911 IF PAIN  PERSISTS. 04/10/23   Wyn Jackee VEAR Mickey., NP  SILDENAFIL CITRATE PO Take by mouth as needed.    [provider]  tamsulosin (FLOMAX) 0.4 MG CAPS capsule Take 0.4 mg by mouth daily.    [provider]    Allergies: Patient  has no known allergies.    Review of Systems  Cardiovascular:  Positive for chest pain.  All other systems reviewed and are negative.   Updated Vital Signs BP 134/77   Pulse (!) 59   Temp 97.9 F (36.6 C) (Oral)   Resp 16   Ht 5' 10 (1.778 m)   Wt 93.4 kg   SpO2 100%   BMI 29.56 kg/m   Physical Exam Vitals and nursing note reviewed.  Constitutional:      General: He is not in acute distress.    Appearance: Normal appearance.  HENT:     Head: Normocephalic and atraumatic.  Eyes:     General:        Right eye: No discharge.        Left eye: No discharge.  Cardiovascular:     Comments: Regular rate and rhythm.  S1/S2 are distinct without any evidence of murmur, rubs, or gallops.  Radial pulses are 2+ bilaterally.  Dorsalis pedis pulses are 2+ bilaterally.  No evidence of pedal edema. Pulmonary:     Comments: Clear to auscultation bilaterally.  Normal effort.  No respiratory distress.  No evidence of wheezes, rales, or rhonchi heard throughout. Abdominal:     General: Abdomen is flat. Bowel sounds are normal. There is no distension.  Tenderness: There is no abdominal tenderness. There is no guarding or rebound.  Musculoskeletal:        General: Normal range of motion.     Cervical back: Neck supple.  Skin:    General: Skin is warm and dry.     Findings: No rash.  Neurological:     General: No focal deficit present.     Mental Status: He is alert.  Psychiatric:        Mood and Affect: Mood normal.        Behavior: Behavior normal.     (all labs ordered are listed, but only abnormal results are displayed) Labs Reviewed  COMPREHENSIVE METABOLIC PANEL WITH GFR - Abnormal; Notable for the following components:      Result Value   Glucose, Bld 158 (*)    All other components within normal limits  LIPASE, BLOOD  CBC  MAGNESIUM  TROPONIN I (HIGH SENSITIVITY)  TROPONIN I (HIGH SENSITIVITY)    EKG: None  Radiology: DG Chest 2 View Result Date:  11/01/2023 CLINICAL DATA:  Chest pain. EXAM: CHEST - 2 VIEW COMPARISON:  Chest radiograph dated 06/18/2012. FINDINGS: Shallow inspiration with bibasilar atelectasis. No focal consolidation, pleural effusion or pneumothorax. Stable cardiac silhouette. No acute osseous pathology. IMPRESSION: No active cardiopulmonary disease. Electronically Signed   By: Vanetta Chou M.D.   On: 11/01/2023 16:48     Procedures   Medications Ordered in the ED - No data to display  Clinical Course as of 11/01/23 2156  Wed Nov 01, 2023  1935 CBC Negative. [CF]  1935 Comprehensive metabolic panel(!) Negative. [CF]  1935 Magnesium Negative. [CF]  1935 Lipase, blood Negative for [CF]  1935 Troponin I (High Sensitivity) Initial troponin is negative. [CF]  1935 DG Chest 2 View I personally ordered interpreted the study.  This is negative.  I do agree with the radiologist interpretation. [CF]  2152 Troponin I (High Sensitivity) Delta troponin is negative. [CF]    Clinical Course User Index [CF] Stephen Cameron HERO, PA-C    Medical Decision Making DAIN Curtis is a 73 y.o. male patient who presents to the emergency department today for further evaluation of chest pain. Exam without evidence of volume overload so doubt heart failure. EKG without signs of active ischemia. Given the timing of pain to ER presentation, single and delta troponin normal. Doubt NSTEMI. Presentation not consistent with acute PE, pneumothorax (not visualized on chest xr), thoracic aortic dissection, pericarditis, tamponade, pneumonia (no infectious symptoms, clear chest xr), myocarditis (no recent illness, neg trop). HEART score: 3 so plan to discharge patient home with PMD and cardiology follow up.    Amount and/or Complexity of Data Reviewed Labs: ordered. Decision-making details documented in ED Course. Radiology: ordered. Decision-making details documented in ED Course.     Final diagnoses:  Chest pain, unspecified type     ED Discharge Orders     None          Stephen Curtis DEVONNA 11/01/23 2156    Albertina Dixon, MD 11/02/23 [phone]

## 2023-11-02 ENCOUNTER — Encounter: Payer: Self-pay | Admitting: Cardiology

## 2023-11-02 DIAGNOSIS — M7918 Myalgia, other site: Secondary | ICD-10-CM | POA: Diagnosis not present

## 2023-11-02 DIAGNOSIS — M5416 Radiculopathy, lumbar region: Secondary | ICD-10-CM | POA: Diagnosis not present

## 2023-11-08 DIAGNOSIS — M5416 Radiculopathy, lumbar region: Secondary | ICD-10-CM | POA: Diagnosis not present

## 2023-11-28 DIAGNOSIS — M7918 Myalgia, other site: Secondary | ICD-10-CM | POA: Diagnosis not present

## 2023-11-28 DIAGNOSIS — M5416 Radiculopathy, lumbar region: Secondary | ICD-10-CM | POA: Diagnosis not present

## 2023-11-29 DIAGNOSIS — I10 Essential (primary) hypertension: Secondary | ICD-10-CM | POA: Diagnosis not present

## 2023-11-29 DIAGNOSIS — Z23 Encounter for immunization: Secondary | ICD-10-CM | POA: Diagnosis not present

## 2023-11-29 DIAGNOSIS — Z0000 Encounter for general adult medical examination without abnormal findings: Secondary | ICD-10-CM | POA: Diagnosis not present

## 2023-11-29 DIAGNOSIS — I2510 Atherosclerotic heart disease of native coronary artery without angina pectoris: Secondary | ICD-10-CM | POA: Diagnosis not present

## 2023-11-29 DIAGNOSIS — E782 Mixed hyperlipidemia: Secondary | ICD-10-CM | POA: Diagnosis not present

## 2023-11-29 DIAGNOSIS — R7303 Prediabetes: Secondary | ICD-10-CM | POA: Diagnosis not present

## 2023-12-03 DIAGNOSIS — Z23 Encounter for immunization: Secondary | ICD-10-CM | POA: Diagnosis not present

## 2023-12-15 ENCOUNTER — Other Ambulatory Visit: Payer: Self-pay | Admitting: Nurse Practitioner

## 2024-01-15 ENCOUNTER — Encounter: Payer: Self-pay | Admitting: Cardiology

## 2024-01-26 ENCOUNTER — Encounter: Payer: Self-pay | Admitting: Cardiology

## 2024-03-13 ENCOUNTER — Other Ambulatory Visit: Payer: Self-pay | Admitting: Physician Assistant

## 2024-03-13 ENCOUNTER — Other Ambulatory Visit: Payer: Self-pay | Admitting: Cardiology

## 2024-04-14 NOTE — Progress Notes (Unsigned)
 " Cardiology Office Note:    Date:  04/14/2024   ID:  Stephen Curtis, DOB 1951-02-29, MRN [id number]  PCP:  Dayna Motto, DO  Cardiologist:  Wilbert Bihari, MD    Referring MD: Dayna Motto, DO   No chief complaint on file.   History of Present Illness:    Stephen Curtis is a 74 y.o. male with a hx of CAD and cath with subtotalled RCA with borderline 50-70% mid LAD s/p PCI of RCA 2014, dyslipidemia and vasovagal syncope  The patient is here today and is doing well.  He denies any CP or pressure, SOB, DOE, PND, orthopnea, LE edema, dizziness, palpitations or syncope. Past Medical History:  Diagnosis Date   BPH (benign prostatic hypertrophy)    CAD (coronary artery disease)    subtotalled RCA wth borderline 50-70% mid LAD s/p DES to RCA   Hyperlipidemia LDL goal <70    Retinopathy    Vasovagal syncope    Vertigo    hearing loss left ear evaluated by ENT Dr. Roark    Past Surgical History:  Procedure Laterality Date   CARDIAC CATHETERIZATION     FLEXIBLE SIGMOIDOSCOPY     knee arthroscopic Left    LEFT HEART CATHETERIZATION WITH CORONARY ANGIOGRAM N/A 05/17/2012   Procedure: LEFT HEART CATHETERIZATION WITH CORONARY ANGIOGRAM;  Surgeon: [name], MD;  Location: [facility] CATH LAB;  Service: Cardiovascular;  Laterality: N/A;    Current Medications: No outpatient medications have been marked as taking for the 04/15/24 encounter (Appointment) with Bihari Wilbert SAUNDERS, MD.   Current Facility-Administered Medications for the 04/15/24 encounter (Appointment) with Bihari Wilbert SAUNDERS, MD  Medication   triamcinolone  acetonide (KENALOG ) 10 MG/ML injection 10 mg     Allergies:   Patient has no known allergies.   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   Social Drivers of Health   Tobacco Use: Low Risk (11/01/2023)   Patient History    Smoking Tobacco Use: Never    Smokeless Tobacco Use: Never    Passive Exposure: Not on file  Financial Resource Strain: Low Risk (04/12/2024)   Overall Financial Resource Strain (CARDIA)    Difficulty of Paying Living Expenses: Not hard at all  Food Insecurity: No Food Insecurity (04/12/2024)   Epic    Worried About Radiation Protection Practitioner of Food in the Last Year: Never true    Ran Out of Food in the Last Year: Never true  Transportation Needs: No Transportation Needs (04/12/2024)   Epic    Lack of Transportation (Medical): No    Lack of Transportation (Non-Medical): No  Physical Activity: Insufficiently Active (04/12/2024)   Exercise Vital Sign    Days of Exercise per Week: 2 days    Minutes of Exercise per Session: 40 min  Stress: No Stress Concern Present (04/12/2024)   [employer] of Occupational Health -  Occupational Stress Questionnaire    Feeling of Stress: Not at all  Social Connections: Socially Integrated (04/12/2024)   Social Connection and Isolation Panel    Frequency of Communication with Friends and Family: More than three times a week    Frequency of Social Gatherings with Friends and Family: Once a week    Attends Religious Services: More than 4 times per year    Active Member of Clubs or Organizations: Yes    Attends Banker Meetings: More than 4 times per year    Marital Status: Married  Depression (EYV7-0): Not on file  Alcohol Screen: Low Risk (04/12/2024)   Alcohol Screen    Last Alcohol Screening Score (AUDIT): 3  Housing: Low Risk (04/12/2024)   Epic    Unable to Pay for Housing  in the Last Year: No    Number of Times Moved in the Last Year: 0    Homeless in the Last Year: No  Utilities: Not on file  Health Literacy: Not on file     Family History: The patient's family history includes Cancer in his father; Colon cancer in his sister; Diabetes in an other family member; Heart attack in his mother; Heart disease in his mother; Hypertension in an other family member.  ROS:   Please see the history of present illness.    ROS  All other systems reviewed and negative.   EKGs/Labs/Other Studies Reviewed:    The following studies were reviewed today: none    Recent Labs: 11/01/2023: ALT 27; BUN 11; Creatinine, Ser 1.09; Hemoglobin 14.8; Magnesium 2.0; Platelets 373; Potassium 3.6; Sodium 141   Recent Lipid Panel    Component Value Date/Time   CHOL 119 11/12/2019 0736   CHOL 106 02/01/2013 0800   TRIG 53 11/12/2019 0736   TRIG 45 02/01/2013 0800   HDL 40 11/12/2019 0736   HDL 40 02/01/2013 0800   CHOLHDL 3.0 11/12/2019 0736   CHOLHDL 3.3 03/04/2016 0811   VLDL 13 03/04/2016 0811   LDLCALC 67 11/12/2019 0736   LDLCALC 57 02/01/2013 0800   LDLDIRECT 58.4 04/17/2013 0739    Physical Exam:    VS:  There were no vitals taken for this visit.    Wt Readings from Last 3 Encounters:  11/01/23 206 lb (93.4 kg)  04/10/23 210 lb 9.6 oz (95.5 kg)  03/03/22 211 lb 12.8 oz (96.1 kg)    GEN: Well nourished, well developed in no acute distress HEENT: Normal NECK: No JVD; No carotid bruits LYMPHATICS: No lymphadenopathy CARDIAC:RRR, no murmurs, rubs, gallops RESPIRATORY:  Clear to auscultation without rales, wheezing or rhonchi  ABDOMEN: Soft, non-tender, non-distended MUSCULOSKELETAL:  No edema; No deformity  SKIN: Warm and dry NEUROLOGIC:  Alert and oriented x 3 PSYCHIATRIC:  Normal affect  ASSESSMENT:    1. Coronary artery disease involving native coronary artery of native heart without angina pectoris   2. Pure hypercholesterolemia   3. Vasovagal  syncope   4. Hypertension, unspecified type     PLAN:    In order of problems listed above:  ASCAD  -cath with subtotalled RCA with borderline 50-70% mid LAD s/p PCI of RCA 2014.   -He has not had any CP or SOB since a year aog -continue ASA 81mg  daily and Atorvastatin  80mg  daily with PRN refills  Hyperlipidemia  -LDL goal is < 70.  -I have personally reviewed and interpreted outside labs performed by patient's PCP which showed *** -continue Atorvastatin  80mg  daily with PRN refills  Vasovagal  syncope  -denies any recent dizziness or syncope  HTN -BP controlled today at *** -Continue Losartan  50mg  daily with PRN refills -I have personally reviewed and interpreted outside performed by patient's PCP which showed ***  Medication Adjustments/Labs and Tests Ordered: Current medicines are reviewed at length with the patient today.  Concerns regarding medicines are outlined above.  No orders of the defined types were placed in this encounter.   No orders of the defined types were placed in this encounter.    Signed, Wilbert Bihari, MD  04/14/2024 9:41 PM    [facility] "

## 2024-04-15 ENCOUNTER — Encounter: Payer: Self-pay | Admitting: Cardiology

## 2024-04-15 ENCOUNTER — Ambulatory Visit: Admitting: Cardiology

## 2024-04-15 DIAGNOSIS — I2510 Atherosclerotic heart disease of native coronary artery without angina pectoris: Secondary | ICD-10-CM

## 2024-04-15 DIAGNOSIS — R55 Syncope and collapse: Secondary | ICD-10-CM

## 2024-04-15 DIAGNOSIS — I10 Essential (primary) hypertension: Secondary | ICD-10-CM

## 2024-04-15 DIAGNOSIS — E7800 Pure hypercholesterolemia, unspecified: Secondary | ICD-10-CM

## 2024-04-17 ENCOUNTER — Encounter: Payer: Self-pay | Admitting: Cardiology

## 2024-04-18 NOTE — Telephone Encounter (Signed)
 Pt scheduled 05/22/24

## 2024-05-22 ENCOUNTER — Ambulatory Visit: Admitting: Cardiology
